# Patient Record
Sex: Male | Born: 2000 | Race: White | Hispanic: No | Marital: Single | Smoking: Never smoker
Health system: Southern US, Community
[De-identification: ages and names within clinical notes are randomized; demographics above are authoritative.]

## PROBLEM LIST (undated history)

## (undated) DIAGNOSIS — F988 Other specified behavioral and emotional disorders with onset usually occurring in childhood and adolescence: Secondary | ICD-10-CM

## (undated) HISTORY — PX: GUM SURGERY: SHX658

## (undated) HISTORY — PX: OTHER SURGICAL HISTORY: SHX169

---
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
## 2020-06-04 ENCOUNTER — Ambulatory Visit (HOSPITAL_COMMUNITY): Admission: EM | Admit: 2020-06-04 | Discharge status: Absent without leave | Payer: Self-pay | Source: Home / Self Care

## 2020-06-04 ENCOUNTER — Ambulatory Visit (INDEPENDENT_AMBULATORY_CARE_PROVIDER_SITE_OTHER): Payer: Commercial Managed Care - PPO

## 2020-06-04 ENCOUNTER — Encounter (HOSPITAL_COMMUNITY): Payer: Self-pay

## 2020-06-04 ENCOUNTER — Other Ambulatory Visit: Payer: Self-pay

## 2020-06-04 ENCOUNTER — Ambulatory Visit (HOSPITAL_COMMUNITY)
Admission: EM | Admit: 2020-06-04 | Discharge: 2020-06-04 | Disposition: A | Discharge status: Home / Self Care | Payer: Commercial Managed Care - PPO | Attending: Student | Admitting: Student

## 2020-06-04 DIAGNOSIS — S92424A Nondisplaced fracture of distal phalanx of right great toe, initial encounter for closed fracture: Secondary | ICD-10-CM | POA: Diagnosis not present

## 2020-06-04 DIAGNOSIS — M79671 Pain in right foot: Secondary | ICD-10-CM

## 2020-06-04 DIAGNOSIS — W19XXXA Unspecified fall, initial encounter: Secondary | ICD-10-CM | POA: Diagnosis not present

## 2020-06-04 NOTE — Discharge Instructions (Addendum)
-  Use your boot during the day, and at all times while walking.  -Follow-up with orthopedic doctor in the next 2-3 days (information provided below).

## 2020-06-04 NOTE — ED Provider Notes (Signed)
[facility]    CSN: [id number] Arrival date & time: 06/04/20  1328      History   Chief Complaint Chief Complaint  Patient presents with  . Foot Pain    HPI Jack Buchanan is a 20 y.o. male presenting for R foot pain since this morning. States he fell down stairs last night and didn notice pain until this morning. States he was walking down stairs and the automatic light went out, causing him to trip. He caught himself, but his right foot twisted under him. At the time it didn't hurt. However he woke up this mornign and noted pain with standing and moving his toes. deneis ankle injury. 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 Denies injury elsewhere.  HPI  Past Medical History:  Diagnosis Date  . ADD (attention deficit disorder)     Patient Active Problem List   Diagnosis Date Noted  . Precocious sexual development and puberty, not elsewhere classified 10/17/2010  . Lack of expected normal physiological development in childhood 10/17/2010  . Goiter, unspecified 10/17/2010    Past Surgical History:  Procedure Laterality Date  . GUM SURGERY    . tubes in ears         Home Medications    Prior to Admission medications   Medication Sig Start Date End Date Taking? Authorizing Provider  dexmethylphenidate (FOCALIN XR) 15 MG 24 hr capsule Take 15 mg by mouth every morning.    [provider]    Family History History reviewed. No pertinent family history.  Social History Social History   Tobacco Use  . Smoking status: Light Tobacco Smoker    Types: Cigarettes  . Smokeless tobacco: Never Used  Substance Use Topics  . Alcohol use: Yes  . Drug use: Never     Allergies   Motrin [ibuprofen]   Review of Systems Review of Systems  Musculoskeletal:       R foot pain  All other systems reviewed and are negative.    Physical Exam Triage Vital Signs ED Triage Vitals [06/04/20 1505]  Enc Vitals Group     BP (!)  135/59     Pulse Rate 65     Resp 17     Temp 98 F (36.7 C)     Temp Source Oral     SpO2 98 %     Weight      Height      Head Circumference      Peak Flow      Pain Score 4     Pain Loc      Pain Edu?      Excl. in GC?    No data found.  Updated Vital Signs BP (!) 135/59 (BP Location: Right Arm)   Pulse 65   Temp 98 F (36.7 C) (Oral)   Resp 17   SpO2 98%   Visual Acuity Right Eye Distance:   Left Eye Distance:   Bilateral Distance:    Right Eye Near:   Left Eye Near:    Bilateral Near:     Physical Exam Vitals reviewed.  Constitutional:      Appearance: Normal appearance.  Cardiovascular:     Rate and Rhythm: Normal rate and regular rhythm.     Heart sounds: Normal heart sounds.  Pulmonary:     Effort: Pulmonary effort is normal.     Breath sounds: Normal breath sounds.  Musculoskeletal:  Right foot: Normal range of motion. Tenderness and bony tenderness present. No swelling, deformity, foot drop or laceration.     Left foot: Normal. Normal range of motion. No swelling, deformity, foot drop, laceration, tenderness or bony tenderness.       Legs:     Comments: R foot with tenderness to palpation base R great toe. Tenderness extends through medial metatarsal bones. No bony deformity palpated, no ecchymosis or abrasion. ROM intact but with pain.   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 Nontender.   Neurological:     General: No focal deficit present.     Mental Status: He is alert and oriented to person, place, and time.  Psychiatric:        Mood and Affect: Mood normal.        Behavior: Behavior normal.        Thought Content: Thought content normal.        Judgment: Judgment normal.      UC Treatments / Results  Labs (all labs ordered are listed, but only abnormal results are displayed) Labs Reviewed - No data to display  EKG   Radiology DG Foot Complete Right  Result Date: 06/04/2020 CLINICAL DATA:  20 year old male  with fall. EXAM: RIGHT FOOT COMPLETE - 3+ VIEW COMPARISON:  None. FINDINGS: There is an avulsion fracture of the plantar base of the distal phalanx of the great toe. No other acute fracture. The bones are well mineralized. No dislocation. Mild soft tissue swelling of the great toe. No radiopaque foreign object or soft tissue gas. IMPRESSION: Avulsion fracture of the plantar base of the distal phalanx of the great toe. Electronically Signed   By: Elgie Collard M.D.   On: 06/04/2020 15:50    Procedures Procedures (including critical care time)  Medications Ordered in UC Medications - No data to display  Initial Impression / Assessment and Plan / UC Course  I have reviewed the triage vital signs and the nursing notes.  Pertinent labs & imaging results that were available during my care of the patient were reviewed by me and considered in my medical decision making (see chart for details).     Xray R foot: Avulsion fracture of the plantar base of the distal phalanx of the great toe. Boot applied. -Use your boot during the day, and at all times while walking.  -Follow-up with orthopedic doctor in the next 2-3 days (information provided below).  Work note provided.  Tylenol/ibuprofen for pain  Final Clinical Impressions(s) / UC Diagnoses   Final diagnoses:  Nondisplaced fracture of distal phalanx of right great toe, initial encounter for closed fracture     Discharge Instructions     -Use your boot during the day, and at all times while walking.  -Follow-up with orthopedic doctor in the next 2-3 days (information provided below).   ED Prescriptions    None     PDMP not reviewed this encounter.   Rhys Martini, PA-C 06/04/20 1615

## 2020-06-04 NOTE — ED Triage Notes (Signed)
Pt reports pain in the top of right foot since this morning. Reports he fell from the stairs last night and felt no pain.

## 2020-06-05 ENCOUNTER — Ambulatory Visit (INDEPENDENT_AMBULATORY_CARE_PROVIDER_SITE_OTHER): Payer: Commercial Managed Care - PPO | Admitting: Family Medicine

## 2020-06-05 ENCOUNTER — Encounter: Payer: Self-pay | Admitting: Family Medicine

## 2020-06-05 VITALS — BP 128/80 | Ht 71.0 in | Wt 240.0 lb

## 2020-06-05 DIAGNOSIS — S93601A Unspecified sprain of right foot, initial encounter: Secondary | ICD-10-CM | POA: Diagnosis not present

## 2020-06-05 NOTE — Progress Notes (Signed)
    SUBJECTIVE:   CHIEF COMPLAINT / HPI: Follow-up great toe fracture  Jack Buchanan is a 20 year old gentleman presenting for follow-up of a nondisplaced avulsion fracture of his great toe.  He fell on 1/22 down his stairs at home when he tripped due to not being able to see the steps when the lights accidentally turned off.  He rolled/twisted his right foot at that time but was able to get up and walk back to bed without pain.  However, the next morning when he woke up he was having tenderness on the dorsal aspect of his foot. Evaluated at [facility] on 1/23 and obtained x-rays showing an avulsion fracture of the plantar base of the distal phalanx of the right great toe.  Placed in a boot and recommended follow-up with orthopedics/sports medicine.  Today, he reports that his foot is feeling a little bit better since yesterday.  He has been wearing the boot with activity.  He states the pain is still on the dorsal aspect of his midfoot only, he has not had any tenderness with palpation or movement of his great toe.  Initially noted swelling on the upper part of his foot however that has substantially improved.  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 He plans on returning to work in 1 week if doing well.  PERTINENT  PMH / PSH: Anxiety  OBJECTIVE:   BP 128/80   Ht 5\' 11"  (1.803 m)   Wt 240 lb (108.9 kg)   BMI 33.47 kg/m   General: Alert, NAD, girlfriend present in the room HEENT: NCAT, MMM Lungs: No increased WOB   Right Foot compared to left: Inspection:  No erythema or bruising.  Mild soft tissue swelling noted to dorsum of right foot. Normal arch.  Palpation: No TTP to palpation of plantar portion of great toe.  TTP around talonavicular joint.  No tenderness around lateral malleolus. ROM: Full  ROM of the ankle with the exception of limited dorsiflexion on the right due to discomfort.  Full ROM of great toe without pain. Strength: 5/5 strength ankle in all  planes Neurovascular: N/V intact distally in the lower extremity Special tests: Negative anterior drawer. Negative squeeze. normal midfoot flexibility.   Limited foot U/S: No hypoechoic areas or fluid seen around talonavicular joint or over midfoot.  ASSESSMENT/PLAN:    Right foot strain: Acute.  Clinical presentation and history more consistent with sprain of his right foot given dorsal swelling and location of pain.  Reassuringly without any significant fluid or abnormalities on U/S, suggesting against tendinous tear or Lisfranc fracture of his midfoot.  Recommended continued use of boot for the next 2-3 days and then transition into good supportive shoe.  Tylenol/Aleve with frequent icing, rest, and elevation.  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 Will monitor.   Follow-up as needed if not improving.  Due to return to work on Monday, 1/31.  Instructed to call our office around this time if he needs more time from work for resolution.  08-18-1973, DO [facility]

## 2020-06-05 NOTE — Patient Instructions (Signed)
You have a foot sprain. Wear the boot for 2-3 more days when up and walking around then switch to supportive shoes. Icing 15 minutes at a time 3-4 times a day. If you're having problems still on Monday give me a call and I'll write a work note for you. Aleve or ibuprofen only if needed. Follow up with me as needed if you're doing well.

## 2020-11-28 ENCOUNTER — Emergency Department (HOSPITAL_COMMUNITY)
Admission: EM | Admit: 2020-11-28 | Discharge: 2020-11-28 | Disposition: A | Discharge status: Home / Self Care | Payer: Commercial Managed Care - PPO

## 2020-12-21 ENCOUNTER — Other Ambulatory Visit: Payer: Self-pay

## 2020-12-21 ENCOUNTER — Ambulatory Visit (INDEPENDENT_AMBULATORY_CARE_PROVIDER_SITE_OTHER): Payer: Commercial Managed Care - PPO | Admitting: Physician Assistant

## 2020-12-21 ENCOUNTER — Encounter: Payer: Self-pay | Admitting: Physician Assistant

## 2020-12-21 VITALS — BP 110/80 | HR 47 | Temp 97.2°F | Ht 71.0 in | Wt 224.2 lb

## 2020-12-21 DIAGNOSIS — R030 Elevated blood-pressure reading, without diagnosis of hypertension: Secondary | ICD-10-CM

## 2020-12-21 DIAGNOSIS — I861 Scrotal varices: Secondary | ICD-10-CM

## 2020-12-21 DIAGNOSIS — R001 Bradycardia, unspecified: Secondary | ICD-10-CM

## 2020-12-21 LAB — CBC WITH DIFFERENTIAL/PLATELET
Basophils Absolute: 0 10*3/uL (ref 0.0–0.1)
Basophils Relative: 0.5 % (ref 0.0–3.0)
Eosinophils Absolute: 0.1 10*3/uL (ref 0.0–0.7)
Eosinophils Relative: 1.6 % (ref 0.0–5.0)
HCT: 45.3 % (ref 39.0–52.0)
Hemoglobin: 15.5 g/dL (ref 13.0–17.0)
Lymphocytes Relative: 37.8 % (ref 12.0–46.0)
Lymphs Abs: 2 10*3/uL (ref 0.7–4.0)
MCHC: 34.3 g/dL (ref 30.0–36.0)
MCV: 88.2 fl (ref 78.0–100.0)
Monocytes Absolute: 0.5 10*3/uL (ref 0.1–1.0)
Monocytes Relative: 9.7 % (ref 3.0–12.0)
Neutro Abs: 2.6 10*3/uL (ref 1.4–7.7)
Neutrophils Relative %: 50.4 % (ref 43.0–77.0)
Platelets: 227 10*3/uL (ref 150.0–400.0)
RBC: 5.14 Mil/uL (ref 4.22–5.81)
RDW: 12.9 % (ref 11.5–15.5)
WBC: 5.2 10*3/uL (ref 4.0–10.5)

## 2020-12-21 LAB — COMPREHENSIVE METABOLIC PANEL
ALT: 36 U/L (ref 0–53)
AST: 30 U/L (ref 0–37)
Albumin: 4.2 g/dL (ref 3.5–5.2)
Alkaline Phosphatase: 71 U/L (ref 39–117)
BUN: 22 mg/dL (ref 6–23)
CO2: 28 mEq/L (ref 19–32)
Calcium: 9.5 mg/dL (ref 8.4–10.5)
Chloride: 103 mEq/L (ref 96–112)
Creatinine, Ser: 1.02 mg/dL (ref 0.40–1.50)
GFR: 104.73 mL/min (ref >60.00)
Glucose, Bld: 85 mg/dL (ref 70–99)
Potassium: 4.5 mEq/L (ref 3.5–5.1)
Sodium: 138 mEq/L (ref 135–145)
Total Bilirubin: 1 mg/dL (ref 0.2–1.2)
Total Protein: 7.2 g/dL (ref 6.0–8.3)

## 2020-12-21 LAB — TSH: TSH: 1.26 u[IU]/mL (ref 0.35–5.50)

## 2020-12-21 NOTE — Patient Instructions (Addendum)
It was great to see you!  CUT OUT THE SALT!!!!  Check BP once a week and bring record with you at next visit. If consistently <140/90, please come see me sooner rather than later.  If your varicocele starts to bother you more, let me know and I will refer you to urology.  Come see me in 1-3 months for physical, sooner if concerns.  Lelon Mast

## 2020-12-21 NOTE — Progress Notes (Signed)
Jack Buchanan is a 20 y.o. male here to establish care.  I acted as a Neurosurgeon for [employer], PA-C Corky Mull, LPN   History of Present Illness:   Chief Complaint  Patient presents with   Establish Care   Hypertension    Pt was told a couple of years ago that he had elevated BP readings at a Dentist office. He denies chest pain, headache, or leg swelling.    Elevated blood pressure Currently taking no medications. At home blood pressure readings are: 170/100 at the dentist. Patient denies chest pain, SOB, blurred vision, dizziness, unusual headaches, lower leg swelling. Denies excessive caffeine intake, stimulant usage, excessive alcohol intake.  Eats a ton of salt per his report. Drinks plenty of water and no alcohol.   BP Readings from Last 3 Encounters:  12/21/20 110/80  06/05/20 128/80  06/04/20 (!) 135/59    Depression screen PHQ 2/9 12/21/2020  Decreased Interest 0  Down, Depressed, Hopeless 0  PHQ - 2 Score 0    Varicocele Went to the ER in July for sudden acute scrotal pain. 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 He has used compressive underwear.  No flowsheet data found.   Other providers/specialists: Patient Care Team: Jarold Motto, [name] as PCP - General (Physician Assistant)   Past Medical History:  Diagnosis Date   ADD (attention deficit disorder)      Social History   Tobacco Use   Smoking status: Never   Smokeless tobacco: Never  Substance Use Topics   Alcohol use: Yes   Drug use: Never    Past Surgical History:  Procedure Laterality Date   GUM SURGERY     tubes in ears      Family History  Problem Relation Age of Onset   Diabetes Mother    Skin cancer Mother    Depression Mother    Diabetes Father     Allergies  Allergen Reactions   Motrin [Ibuprofen] Anaphylaxis     Current Medications:   Current Outpatient Medications:    dexmethylphenidate (FOCALIN XR) 15 MG 24 hr capsule, Take 15 mg by mouth every  morning., Disp: , Rfl:    Review of Systems:   ROS Negative unless otherwise specified per HPI.  Vitals:   Vitals:   12/21/20 1034 12/21/20 1109  BP: 110/60 110/80  Pulse: (!) 51 (!) 47  Temp: (!) 97.2 F (36.2 C)   TempSrc: Temporal   SpO2: 99%   Weight: 224 lb 3.2 oz (101.7 kg)   Height: 5\' 11"  (1.803 m)       Body mass index is 31.27 kg/m.  Physical Exam:   Physical Exam Vitals and nursing note reviewed.  Constitutional:      General: He is not in acute distress.    Appearance: He is well-developed. He is not ill-appearing or toxic-appearing.  Cardiovascular:     Rate and Rhythm: Regular rhythm. Bradycardia present.     Pulses: Normal pulses.     Heart sounds: Normal heart sounds, S1 normal and S2 normal.     Comments: No LE edema Pulmonary:     Effort: Pulmonary effort is normal.     Breath sounds: Normal breath sounds.  Skin:    General: Skin is warm and dry.  Neurological:     Mental Status: He is alert.     GCS: GCS eye subscore is 4. GCS verbal subscore is 5. GCS motor subscore is 6.  Psychiatric:  Speech: Speech normal.        Behavior: Behavior normal. Behavior is cooperative.     Assessment and Plan:   Quantrell was seen today for establish care and hypertension.  Diagnoses and all orders for this visit:  Blood pressure elevated without history of HTN; Bradycardia EKG tracing is personally reviewed.  EKG notes NSR with bradycardia.  No acute changes.  No red flags. Recommend check BP regularly weekly and follow-up in 1-3 months, sooner if BP consistently <140/90 or if symptomatic. Work on salt intake. -     EKG 12-Lead -     CBC with Differential/Platelet -     Comprehensive metabolic panel -     TSH  Varicocele No red flags on discussion. Literature provided. If becomes more bothersome, will refer to urology.  CMA or LPN served as scribe during this visit. History, Physical, and Plan performed by medical provider. The above  documentation has been reviewed and is accurate and complete.  Jarold Motto, PA-C

## 2021-02-01 ENCOUNTER — Encounter: Payer: Commercial Managed Care - PPO | Admitting: Physician Assistant

## 2021-02-06 ENCOUNTER — Ambulatory Visit (INDEPENDENT_AMBULATORY_CARE_PROVIDER_SITE_OTHER): Payer: Commercial Managed Care - PPO | Admitting: Physician Assistant

## 2021-02-06 ENCOUNTER — Other Ambulatory Visit: Payer: Self-pay

## 2021-02-06 ENCOUNTER — Encounter: Payer: Self-pay | Admitting: Physician Assistant

## 2021-02-06 VITALS — BP 120/76 | HR 60 | Temp 97.9°F | Ht 71.0 in | Wt 226.4 lb

## 2021-02-06 DIAGNOSIS — Z0001 Encounter for general adult medical examination with abnormal findings: Secondary | ICD-10-CM

## 2021-02-06 DIAGNOSIS — Z114 Encounter for screening for human immunodeficiency virus [HIV]: Secondary | ICD-10-CM

## 2021-02-06 DIAGNOSIS — Z1159 Encounter for screening for other viral diseases: Secondary | ICD-10-CM | POA: Diagnosis not present

## 2021-02-06 DIAGNOSIS — Z136 Encounter for screening for cardiovascular disorders: Secondary | ICD-10-CM

## 2021-02-06 DIAGNOSIS — Z13220 Encounter for screening for lipoid disorders: Secondary | ICD-10-CM | POA: Diagnosis not present

## 2021-02-06 DIAGNOSIS — Z8659 Personal history of other mental and behavioral disorders: Secondary | ICD-10-CM

## 2021-02-06 DIAGNOSIS — R030 Elevated blood-pressure reading, without diagnosis of hypertension: Secondary | ICD-10-CM

## 2021-02-06 DIAGNOSIS — Z23 Encounter for immunization: Secondary | ICD-10-CM | POA: Diagnosis not present

## 2021-02-06 DIAGNOSIS — F200 Paranoid schizophrenia: Secondary | ICD-10-CM | POA: Insufficient documentation

## 2021-02-06 DIAGNOSIS — E669 Obesity, unspecified: Secondary | ICD-10-CM

## 2021-02-06 DIAGNOSIS — G479 Sleep disorder, unspecified: Secondary | ICD-10-CM | POA: Insufficient documentation

## 2021-02-06 LAB — LIPID PANEL
Cholesterol: 162 mg/dL (ref 0–200)
HDL: 37 mg/dL — ABNORMAL LOW (ref >39.00)
LDL Cholesterol: 105 mg/dL — ABNORMAL HIGH (ref 0–99)
NonHDL: 125.35
Total CHOL/HDL Ratio: 4
Triglycerides: 104 mg/dL (ref 0.0–149.0)
VLDL: 20.8 mg/dL (ref 0.0–40.0)

## 2021-02-06 NOTE — Patient Instructions (Signed)
It was great to see you!  Referral for sleep study -- this is very important!  If you develop suicidal thoughts, please tell someone and immediately proceed to our local 24/7 crisis center, [facility]. [address] [place], [place] [address] [phone].  Please go to the lab for blood work.   Our office will call you with your results unless you have chosen to receive results via MyChart.  If your blood work is normal we will follow-up each year for physicals and as scheduled for chronic medical problems.  If anything is abnormal we will treat accordingly and get you in for a follow-up.  Take care,  Lelon Mast

## 2021-02-06 NOTE — Progress Notes (Addendum)
Subjective:    Jack Buchanan is a 20 y.o. male and is here for a comprehensive physical exam.  HPI  Health Maintenance Due  Topic Date Due   HIV Screening  Never done   Hepatitis C Screening  Never done   TETANUS/TDAP  01/10/2020    Acute Concerns Sleep Apnea:  He reports that he has concerns of possibly having sleep apnea, has daytime somnolence and does snore at night.  Denies ever being in a major accident. Snores at night per girlfriend, he has never had a sleep study.   Schizophrenia In the past he would have panic attacks daily when he looks at objects that looks weird. He had excessive depression and anxiety episodes at the age of 89-14 but returned as an adult. His mother has bipolar disorder and his father also has some underlying mental issues (unknown). Denies any HI or SI at this time. Does report history of visual and auditory hallucinations.  Chronic Issues: Elevated Blood Pressure:  He recently went to the dental office and had an elevated blood pressure.  He has had overall normal blood pressures since this visit.  Denies: Chest pain, shortness of breath, lower extremity swelling, vision changes. BP Readings from Last 3 Encounters:  02/06/21 120/76  12/21/20 110/80  06/05/20 128/80   Health Maintenance: Immunizations -- UTD Colonoscopy -- Not completed due to age  PSA -- Not complete due to age  Diet -- Reasonably healthy diet(chicken, vegetables, water, protein shake)  Sleep habits -- Snoring at times and randomly falls asleep at the wheel when driving.  Exercise -- Daily completes formal exercise  Weight -- stable Weight history Wt Readings from Last 10 Encounters:  02/06/21 226 lb 6.1 oz (102.7 kg)  12/21/20 224 lb 3.2 oz (101.7 kg)  06/05/20 240 lb (108.9 kg)  01/22/12 143 lb 15.4 oz (65.3 kg) (94 %, Z= 1.60)*   * Growth percentiles are based on CDC (Boys, 2-20 Years) data.   Body mass index is 31.57 kg/m.  Tobacco use --  Never Tobacco Use:  Low Risk    Smoking Tobacco Use: Never   Smokeless Tobacco Use: Never    Alcohol use --- 2 beverages in his lifetime  Depression screen [facility] 2/9 12/21/2020  Decreased Interest 0  Down, Depressed, Hopeless 0  PHQ - 2 Score 0   Other providers/specialists: Patient Care Team: Jarold Motto, [name] as PCP - General (Physician Assistant)   PMHx, SurgHx, SocialHx, Medications, and Allergies were reviewed in the Visit Navigator and updated as appropriate.   Past Medical History:  Diagnosis Date   ADD (attention deficit disorder)    Past Surgical History:  Procedure Laterality Date   GUM SURGERY     tubes in ears       Family History  Problem Relation Age of Onset   Diabetes Mother    Skin cancer Mother    Depression Mother    Diabetes Father     Social History   Tobacco Use   Smoking status: Never   Smokeless tobacco: Never  Substance Use Topics   Alcohol use: Yes    Comment: very rare   Drug use: Never    Review of Systems:   Review of Systems  Constitutional:  Negative for chills, fever, malaise/fatigue and weight loss.  HENT:  Negative for hearing loss, sinus pain and sore throat.   Respiratory:  Negative for cough and hemoptysis.   Cardiovascular:  Negative for chest pain, palpitations, leg swelling  and PND.  Gastrointestinal:  Negative for abdominal pain, constipation, diarrhea, heartburn, nausea and vomiting.  Genitourinary:  Negative for dysuria, frequency and urgency.  Musculoskeletal:  Negative for back pain, myalgias and neck pain.  Skin:  Negative for itching and rash.  Neurological:  Negative for dizziness, tingling, seizures and headaches.  Endo/Heme/Allergies:  Negative for polydipsia.  Psychiatric/Behavioral:  Negative for depression. The patient is not nervous/anxious.    Objective:   Vitals:   02/06/21 1100  BP: 120/76  Pulse: 60  Temp: 97.9 F (36.6 C)  SpO2: 98%   Body mass index is 31.57 kg/m.  General Appearance:  Alert,  cooperative, no distress, appears stated age  Head:  Normocephalic, without obvious abnormality, atraumatic  Eyes:  PERRL, conjunctiva/corneas clear, EOM's intact, fundi benign, both eyes, no nystagmus       Ears:  Normal TM's and external ear canals, both ears  Nose: Nares normal, septum midline, mucosa normal, no drainage    or sinus tenderness  Throat: Lips, mucosa, and tongue normal; teeth and gums normal  Neck: Supple, symmetrical, trachea midline, no adenopathy; thyroid:  No enlargement/tenderness/nodules; no carotit bruit or JVD  Back:   Symmetric, no curvature, ROM normal, no CVA tenderness  Lungs:   Clear to auscultation bilaterally, respirations unlabored  Chest wall:  No tenderness or deformity  Heart:  Regular rate and rhythm, S1 and S2 normal, no murmur, rub   or gallop  Abdomen:   Soft, non-tender, bowel sounds active all four quadrants, no masses, no organomegaly  Extremities: Extremities normal, atraumatic, no cyanosis or edema  Prostate: Not done.   Skin: Skin color, texture, turgor normal, no rashes or lesions  Lymph nodes: Cervical, supraclavicular, and axillary nodes normal  Neurologic: CNII-XII grossly intact. Normal strength, sensation and reflexes throughout    Assessment/Plan:   Encounter for general adult medical examination with abnormal findings Today patient counseled on age appropriate routine health concerns for screening and prevention, each reviewed and up to date or declined. Immunizations reviewed and up to date or declined. Labs ordered and reviewed. Risk factors for depression reviewed and negative. Hearing function and visual acuity are intact. ADLs screened and addressed as needed. Functional ability and level of safety reviewed and appropriate. Education, counseling and referrals performed based on assessed risks today. Patient provided with a copy of personalized plan for preventive services.  Screening for HIV (human immunodeficiency virus) Performed  today  Encounter for lipid screening for cardiovascular disease Update blood work today and provide recommendations accordingly  Encounter for screening for other viral diseases Performed hep C screening today  Need for prophylactic vaccination with combined diphtheria-tetanus-pertussis (DTP) vaccine Updated today  Blood pressure elevated without history of HTN Normotensive Continue to monitor as needed when symptomatic If blood pressure greater than 140/90 on a consistent basis, follow-up in our office Follow-up in 1 year  Personal history of schizophrenia Patient declines any need for psychiatry involvement at this time He appears without any significant psychosocial concerns today I discussed with patient that if they develop any SI, to tell someone immediately and seek medical attention.  Obesity  Continue to work on healthy diet and exercise regularly.  sleep disorder Referral for formal sleep study for further evaluation   Patient Counseling: [x]   Nutrition: Stressed importance of moderation in sodium/caffeine intake, saturated fat and cholesterol, caloric balance, sufficient intake of fresh fruits, vegetables, and fiber.  [x]   Stressed the importance of regular exercise.   []   Substance Abuse: Discussed cessation/primary prevention  of tobacco, alcohol, or other drug use; driving or other dangerous activities under the influence; availability of treatment for abuse.   [x]   Injury prevention: Discussed safety belts, safety helmets, smoke detector, smoking near bedding or upholstery.   []   Sexuality: Discussed sexually transmitted diseases, partner selection, use of condoms, avoidance of unintended pregnancy  and contraceptive alternatives.   [x]   Dental health: Discussed importance of regular tooth brushing, flossing, and dental visits.  [x]   Health maintenance and immunizations reviewed. Please refer to Health maintenance section.     I,Havlyn C Ratchford,acting as a  for , PA.,have documented all relevant documentation on the behalf of , PA,as directed by  , PA while in the presence of Neurosurgeon, [employer].    I, Jarold Motto, Jarold Motto, have reviewed all documentation for this visit. The documentation on 02/06/21 for the exam, diagnosis, procedures, and orders are all accurate and complete.   [name], PA-C [facility]

## 2021-02-06 NOTE — Progress Notes (Deleted)
SCRIBE STATEMENT  Subjective:    Jack Buchanan is a 20 y.o. male and is here for a comprehensive physical exam.  HPI  Health Maintenance Due  Topic Date Due   COVID-19 Vaccine (1) Never done   HIV Screening  Never done   Hepatitis C Screening  Never done   TETANUS/TDAP  01/10/2020   INFLUENZA VACCINE  12/11/2020    Acute Concerns:   Chronic Issues:   Health Maintenance: Immunizations -- *** Colonoscopy -- N/A PSA -- No results found for: PSA1, PSA Diet -- *** Sleep habits -- *** Exercise -- *** Weight --    Weight history Wt Readings from Last 10 Encounters:  12/21/20 224 lb 3.2 oz (101.7 kg)  06/05/20 240 lb (108.9 kg)  01/22/12 143 lb 15.4 oz (65.3 kg) (94 %, Z= 1.60)*   * Growth percentiles are based on CDC (Boys, 2-20 Years) data.   There is no height or weight on file to calculate BMI. Mood -- *** Tobacco use --  Tobacco Use: Low Risk    Smoking Tobacco Use: Never   Smokeless Tobacco Use: Never    Alcohol use ---  reports current alcohol use.   Depression screen PHQ 2/9 12/21/2020  Decreased Interest 0  Down, Depressed, Hopeless 0  PHQ - 2 Score 0     Other providers/specialists: Patient Care Team: Jarold Motto, [name] as PCP - General (Physician Assistant)   PMHx, SurgHx, SocialHx, Medications, and Allergies were reviewed in the Visit Navigator and updated as appropriate.   Past Medical History:  Diagnosis Date   ADD (attention deficit disorder)      Past Surgical History:  Procedure Laterality Date   GUM SURGERY     tubes in ears       Family History  Problem Relation Age of Onset   Diabetes Mother    Skin cancer Mother    Depression Mother    Diabetes Father     Social History   Tobacco Use   Smoking status: Never   Smokeless tobacco: Never  Substance Use Topics   Alcohol use: Yes   Drug use: Never    Review of Systems:   ROS  Objective:   There were no vitals filed for this visit. There is no height or weight  on file to calculate BMI.  General Appearance:  Alert, cooperative, no distress, appears stated age  Head:  Normocephalic, without obvious abnormality, atraumatic  Eyes:  PERRL, conjunctiva/corneas clear, EOM's intact, fundi benign, both eyes       Ears:  Normal TM's and external ear canals, both ears  Nose: Nares normal, septum midline, mucosa normal, no drainage    or sinus tenderness  Throat: Lips, mucosa, and tongue normal; teeth and gums normal  Neck: Supple, symmetrical, trachea midline, no adenopathy; thyroid:  No enlargement/tenderness/nodules; no carotit bruit or JVD  Back:   Symmetric, no curvature, ROM normal, no CVA tenderness  Lungs:   Clear to auscultation bilaterally, respirations unlabored  Chest wall:  No tenderness or deformity  Heart:  Regular rate and rhythm, S1 and S2 normal, no murmur, rub   or gallop  Abdomen:   Soft, non-tender, bowel sounds active all four quadrants, no masses, no organomegaly  Extremities: Extremities normal, atraumatic, no cyanosis or edema  Prostate: Not done.   Skin: Skin color, texture, turgor normal, no rashes or lesions  Lymph nodes: Cervical, supraclavicular, and axillary nodes normal  Neurologic: CNII-XII grossly intact. Normal strength, sensation and reflexes throughout  Assessment/Plan:   There are no diagnoses linked to this encounter.    Patient Counseling: [x]   Nutrition: Stressed importance of moderation in sodium/caffeine intake, saturated fat and cholesterol, caloric balance, sufficient intake of fresh fruits, vegetables, and fiber.  [x]   Stressed the importance of regular exercise.   []   Substance Abuse: Discussed cessation/primary prevention of tobacco, alcohol, or other drug use; driving or other dangerous activities under the influence; availability of treatment for abuse.   [x]   Injury prevention: Discussed safety belts, safety helmets, smoke detector, smoking near bedding or upholstery.   []   Sexuality: Discussed  sexually transmitted diseases, partner selection, use of condoms, avoidance of unintended pregnancy  and contraceptive alternatives.   [x]   Dental health: Discussed importance of regular tooth brushing, flossing, and dental visits.  [x]   Health maintenance and immunizations reviewed. Please refer to Health maintenance section.    ***  , PA-C [facility]

## 2021-02-07 LAB — HIV ANTIBODY (ROUTINE TESTING W REFLEX): HIV 1&2 Ab, 4th Generation: NONREACTIVE

## 2021-02-07 LAB — HEPATITIS C ANTIBODY
Hepatitis C Ab: NONREACTIVE
SIGNAL TO CUT-OFF: 0.01 (ref <1.00)

## 2021-04-13 ENCOUNTER — Encounter: Payer: Self-pay | Admitting: Physician Assistant

## 2021-04-13 ENCOUNTER — Other Ambulatory Visit: Payer: Self-pay

## 2021-04-13 ENCOUNTER — Ambulatory Visit (INDEPENDENT_AMBULATORY_CARE_PROVIDER_SITE_OTHER): Payer: Commercial Managed Care - PPO | Admitting: Physician Assistant

## 2021-04-13 VITALS — BP 131/82 | HR 62 | Temp 97.6°F | Ht 71.0 in | Wt 237.4 lb

## 2021-04-13 DIAGNOSIS — J0100 Acute maxillary sinusitis, unspecified: Secondary | ICD-10-CM | POA: Diagnosis not present

## 2021-04-13 DIAGNOSIS — R051 Acute cough: Secondary | ICD-10-CM | POA: Diagnosis not present

## 2021-04-13 MED ORDER — LEVOCETIRIZINE DIHYDROCHLORIDE 5 MG PO TABS: 5.0000 mg | ORAL_TABLET | Freq: Every evening | ORAL | 0 refills | Status: DC

## 2021-04-13 MED ORDER — AMOXICILLIN-POT CLAVULANATE 875-125 MG PO TABS: 1.0000 | ORAL_TABLET | Freq: Two times a day (BID) | ORAL | 0 refills | Status: AC

## 2021-04-13 MED ORDER — PREDNISONE 20 MG PO TABS: 20.0000 mg | ORAL_TABLET | Freq: Two times a day (BID) | ORAL | 0 refills | Status: AC

## 2021-04-13 MED ORDER — BENZONATATE 100 MG PO CAPS: 100.0000 mg | ORAL_CAPSULE | Freq: Three times a day (TID) | ORAL | 0 refills | Status: DC | PRN

## 2021-04-13 NOTE — Patient Instructions (Signed)
Good to meet you. Please take all medications as directed. You may also use nasal saline several times a day to help clear out your sinus passages.

## 2021-04-13 NOTE — Progress Notes (Signed)
Subjective:    Patient ID: Jack Buchanan, male    DOB: Jan 23, 2001, 20 y.o.   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, works outside   Past Medical History:  Diagnosis Date   ADD (attention deficit disorder)     Past Surgical History:  Procedure Laterality Date   GUM SURGERY     tubes in ears      Family History  Problem Relation Age of Onset   Diabetes Mother    Skin cancer Mother    Depression Mother    Bipolar disorder Mother    Diabetes Father     Social History   Tobacco Use   Smoking status: Never   Smokeless tobacco: Never  Substance Use Topics   Alcohol use: Yes    Comment: very rare   Drug use: Never     Allergies  Allergen Reactions   Motrin [Ibuprofen] Anaphylaxis    Review of Systems  Respiratory:  Positive for cough.   NEGATIVE UNLESS OTHERWISE INDICATED IN HPI      Objective:     BP 131/82   Pulse 62   Temp 97.6 F (36.4 C)   Ht 5\' 11"  (1.803 m)   Wt 237 lb 6.1 oz (107.7 kg)   SpO2 98%   BMI 33.11 kg/m   Wt Readings from Last 3 Encounters:  04/13/21 237 lb 6.1 oz (107.7 kg)  02/06/21 226 lb 6.1 oz (102.7 kg)  12/21/20 224 lb 3.2 oz (101.7 kg)    BP Readings from Last 3 Encounters:  04/13/21 131/82  02/06/21 120/76  12/21/20 110/80     Physical Exam Vitals and nursing note reviewed.  Constitutional:      General: He is not in acute distress.    Appearance: Normal appearance. He is not toxic-appearing.  HENT:     Head: Normocephalic and atraumatic.     Right Ear: Ear canal and external ear normal.     Left Ear: Ear canal and external ear normal.     Nose: Congestion present.      Right Turbinates: Enlarged and swollen.     Left Turbinates: Enlarged and swollen.     Right Sinus: Maxillary sinus tenderness present.     Left Sinus: Maxillary sinus tenderness present.     Mouth/Throat:     Mouth: Mucous membranes are moist.     Pharynx: Oropharynx is clear.  Eyes:     Extraocular Movements: Extraocular movements intact.     Conjunctiva/sclera: Conjunctivae normal.     Pupils: Pupils are equal, round, and reactive to light.  Cardiovascular:     Rate and Rhythm: Normal rate and regular rhythm.     Pulses: Normal pulses.     Heart sounds: Normal heart sounds.  Pulmonary:     Effort: Pulmonary effort is normal.     Breath sounds: Normal breath sounds.  Abdominal:     Tenderness: There is no abdominal tenderness.  Musculoskeletal:        General: Normal range of motion.     Cervical back: Normal range of motion and neck supple.  Skin:    General: Skin is  warm and dry.  Neurological:     General: No focal deficit present.     Mental Status: He is alert and oriented to person, place, and time.  Psychiatric:        Mood and Affect: Mood normal.        Behavior: Behavior normal.       Assessment & Plan:   Problem List Items Addressed This Visit   None Visit Diagnoses     Acute maxillary sinusitis, recurrence not specified    -  Primary   Relevant Medications   amoxicillin-clavulanate (AUGMENTIN) 875-125 MG tablet   predniSONE (DELTASONE) 20 MG tablet   benzonatate (TESSALON PERLES) 100 MG capsule   levocetirizine (XYZAL) 5 MG tablet   Acute cough            Meds ordered this encounter  Medications   amoxicillin-clavulanate (AUGMENTIN) 875-125 MG tablet    Sig: Take 1 tablet by mouth 2 (two) times daily for 7 days. Take with food.    Dispense:  14 tablet    Refill:  0   predniSONE (DELTASONE) 20 MG tablet    Sig: Take 1 tablet (20 mg total) by mouth 2 (two) times daily with a meal for 5 days.    Dispense:  10 tablet    Refill:  0   benzonatate  (TESSALON PERLES) 100 MG capsule    Sig: Take 1 capsule (100 mg total) by mouth 3 (three) times daily as needed for cough.    Dispense:  20 capsule    Refill:  0   levocetirizine (XYZAL) 5 MG tablet    Sig: Take 1 tablet (5 mg total) by mouth every evening.    Dispense:  30 tablet    Refill:  0   Plan: Persistent symptoms >10 days despite conservative efforts at home. Will Rx Augmentin at this time, take with food. Cautioned on antibiotic use and possible side effects. Advised nasal saline, humidifier, and pushing fluids. Prednisone, Xyzal, tessalon perles for added relief. Possible SE discussed. Call if worse or no improvement.    Rajah Lamba M Concepcion Gillott, PA-C

## 2021-04-25 ENCOUNTER — Encounter: Payer: Self-pay | Admitting: Physician Assistant

## 2021-04-25 ENCOUNTER — Ambulatory Visit (INDEPENDENT_AMBULATORY_CARE_PROVIDER_SITE_OTHER): Payer: Commercial Managed Care - PPO | Admitting: Physician Assistant

## 2021-04-25 ENCOUNTER — Other Ambulatory Visit: Payer: Self-pay

## 2021-04-25 VITALS — BP 140/80 | HR 72 | Temp 98.1°F | Ht 71.0 in | Wt 234.5 lb

## 2021-04-25 DIAGNOSIS — R052 Subacute cough: Secondary | ICD-10-CM

## 2021-04-25 MED ORDER — FAMOTIDINE 40 MG PO TABS: 40.0000 mg | ORAL_TABLET | Freq: Every day | ORAL | 1 refills | Status: DC

## 2021-04-25 MED ORDER — ALBUTEROL SULFATE HFA 108 (90 BASE) MCG/ACT IN AERS: 2.0000 | INHALATION_SPRAY | Freq: Four times a day (QID) | RESPIRATORY_TRACT | 2 refills | Status: DC | PRN

## 2021-04-25 MED ORDER — MONTELUKAST SODIUM 10 MG PO TABS: 10.0000 mg | ORAL_TABLET | Freq: Every day | ORAL | 1 refills | Status: DC

## 2021-04-25 NOTE — Patient Instructions (Addendum)
It was great to see you!  Start pepcid -- this is for heartburn  Start singulair -- this is for your seasonal allergies -- RARE adverse side effect of this causing suicidal thoughts -- please tell someone you are starting this and if develop suicidal thoughts, stop medication and call 9-1-1  Continue xyzal -- this is for allergies/drainage  Use as needed inhaler for wheezing  Follow-up if no improvement or any worsening in 2-4 weeks  Take care,  Jarold Motto PA-C

## 2021-04-25 NOTE — Progress Notes (Signed)
Jack Buchanan is a 20 y.o. male here for a follow up of sinusitis.    History of Present Illness:   Chief Complaint  Patient presents with   Cough    Pt c/o cough, restarted again couple days ago.    Cough On 04/13/21, Felicia was seen by Ila Mcgill, PA for cough and sinus congestion that had been onset for 1 month and a half. In addition to cough and sinus congestion, he also felt facial pressure. At that time he denied fever, chills, body aches, nausea, vomiting, diarrhea, or headache. Following this visit he was prescribed augmentin 875-125 mg daily, prednisone 20 mg daily, Xyzal 5 mg daily , and tessalon perles 100 mg TID as needed.   Currently Aedin states he had experienced relief upon taking the previosuly prescribed medications, but noticed the cough had returned. Reports the cough occurs sporadically and can be triggered by him laughing at something. States this happened last year but upon increasing his exercise and maintaining a healthy diet, it resolved itself until December of 2021.   In an effort to manage his sx, he has continued taking Xyzal 5 mg daily which provided him with temporary relief. After further discussion, [place] did admit that he has a hx of acid reflux but just noticed this about two weeks ago. Denies fever, chills, CP, or SOB.   Past Medical History:  Diagnosis Date   ADD (attention deficit disorder)      Social History   Tobacco Use   Smoking status: Never   Smokeless tobacco: Never  Substance Use Topics   Alcohol use: Yes    Comment: very rare   Drug use: Never    Past Surgical History:  Procedure Laterality Date   GUM SURGERY     tubes in ears      Family History  Problem Relation Age of Onset   Diabetes Mother    Skin cancer Mother    Depression Mother    Bipolar disorder Mother    Diabetes Father     Allergies  Allergen Reactions   Motrin [Ibuprofen] Anaphylaxis    Current Medications:   Current Outpatient Medications:     benzonatate (TESSALON PERLES) 100 MG capsule, Take 1 capsule (100 mg total) by mouth 3 (three) times daily as needed for cough., Disp: 20 capsule, Rfl: 0   levocetirizine (XYZAL) 5 MG tablet, Take 1 tablet (5 mg total) by mouth every evening., Disp: 30 tablet, Rfl: 0   Review of Systems:   Review of Systems  Respiratory:  Positive for cough.   Negative unless otherwise specified per HPI. Vitals:   Vitals:   04/25/21 1334  BP: 140/80  Pulse: 72  Temp: 98.1 F (36.7 C)  TempSrc: Temporal  SpO2: 98%  Weight: 234 lb 8 oz (106.4 kg)  Height: 5\' 11"  (1.803 m)     Body mass index is 32.71 kg/m.  Physical Exam:   Physical Exam Vitals and nursing note reviewed.  Constitutional:      General: He is not in acute distress.    Appearance: He is well-developed. He is not ill-appearing or toxic-appearing.  Cardiovascular:     Rate and Rhythm: Normal rate and regular rhythm.     Pulses: Normal pulses.     Heart sounds: Normal heart sounds, S1 normal and S2 normal.  Pulmonary:     Effort: Pulmonary effort is normal.     Breath sounds: Normal breath sounds.  Skin:    General: Skin is  warm and dry.  Neurological:     Mental Status: He is alert.     GCS: GCS eye subscore is 4. GCS verbal subscore is 5. GCS motor subscore is 6.  Psychiatric:        Speech: Speech normal.        Behavior: Behavior normal. Behavior is cooperative.    Assessment and Plan:   Subacute cough Uncontrolled Suspect component of upper airway cough syndrome Will start pepcid 40 mg daily, singulair 10 mg daily (black box warning of SI advised) Continue xyzal daily Use albuterol prn No obvious signs of infection on exam  Consider referral to pulmonary if symptoms persist  I,Havlyn C Ratchford,acting as a scribe for [employer], PA.,have documented all relevant documentation on the behalf of Jarold Motto, PA,as directed by  Jarold Motto, PA while in the presence of Jarold Motto, [name].  I,  Jarold Motto, [name], have reviewed all documentation for this visit. The documentation on 04/25/21 for the exam, diagnosis, procedures, and orders are all accurate and complete.   Jarold Motto, PA-C

## 2021-05-02 ENCOUNTER — Encounter: Payer: Self-pay | Admitting: Neurology

## 2021-05-02 ENCOUNTER — Institutional Professional Consult (permissible substitution): Payer: Commercial Managed Care - PPO | Admitting: Neurology

## 2021-05-06 ENCOUNTER — Other Ambulatory Visit: Payer: Self-pay | Admitting: Physician Assistant

## 2021-05-17 NOTE — Progress Notes (Signed)
Jack Buchanan is a 21 y.o. male here for ongoing cough.   History of Present Illness:   Chief Complaint  Patient presents with   Cough    Pt has been having a continuous cough x 1 month. Is having some pain with coughing in back and his head. Coughing and expectorating clear sputum. Denies fever or chills.    HPI  Cough Jack Buchanan returns with concerns due to cough that has been onset for a couple of months. Initially pt was seen by Jack Buchanan and was prescribed augmentin 875-125 mg daily, prednisone 20 mg daily, Xyzal 5 mg daily, and tessalon perles 100 mg TID as needed due to sx. Although he experienced relief, he visited me on 04/25/21 due to recurrence of sx. During this visit,  Jack Buchanan stated his cough had returned sporadically more specifically after he had just laughed at something. He reported that something similar had occurred in 2021 when experienced a cough that had suddenly resolved itself before returning in the winter. At the time of this visit, Jack Buchanan reported that he was still taking Xyzal 5 mg daily which provided temporary relief.   Currently [name] reports that he is now expectorating clear sputum and having longer cough spells with occasional wheezing. [name] expressed that cough has gotten so bad that he'll occasionally have pain in his back and head from coughing. 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 I also informed Jack Buchanan to continue daily use of Xyzal 5 mg.   Although pt has been compliant with these medications and use of albuterol inhaler, about 1-2 times daily, he has only received temporary relief from tessalon perles 100 mg. Pt does have a hx of asthma but doesn't believe current sx to be related to past issue. Denies ear pain, sore throat, or unintentional weight loss.   Past Medical History:  Diagnosis Date   ADD (attention deficit  disorder)      Social History   Tobacco Use   Smoking status: Never   Smokeless tobacco: Never  Substance Use Topics   Alcohol use: Yes    Comment: very rare   Drug use: Never    Past Surgical History:  Procedure Laterality Date   GUM SURGERY     tubes in ears      Family History  Problem Relation Age of Onset   Diabetes Mother    Skin cancer Mother    Depression Mother    Bipolar disorder Mother    Diabetes Father     Allergies  Allergen Reactions   Motrin [Ibuprofen] Anaphylaxis    Current Medications:   Current Outpatient Medications:    albuterol (VENTOLIN HFA) 108 (90 Base) MCG/ACT inhaler, Inhale 2 puffs into the lungs every 6 (six) hours as needed for wheezing or shortness of breath., Disp: 8 g, Rfl: 2   benzonatate (TESSALON) 100 MG capsule, TAKE 1 CAPSULE BY MOUTH THREE TIMES A DAY AS NEEDED FOR COUGH, Disp: 20 capsule, Rfl: 0   famotidine (PEPCID) 40 MG tablet, Take 1 tablet (40 mg total) by mouth daily., Disp: 90 tablet, Rfl: 1   levocetirizine (XYZAL) 5 MG tablet, TAKE 1 TABLET BY MOUTH EVERY DAY IN THE EVENING, Disp: 30 tablet, Rfl: 0   montelukast (SINGULAIR) 10 MG tablet, Take 1 tablet (10 mg total) by mouth at bedtime., Disp: 90 tablet, Rfl: 1  Review of Systems:   ROS Negative unless otherwise specified per HPI.  Vitals:   Vitals:   05/18/21 0901  BP: 130/76  Pulse: 75  Temp: 97.6 F (36.4 C)  TempSrc: Temporal  SpO2: 97%  Weight: 238 lb 4 oz (108.1 kg)  Height: 5\' 11"  (1.803 m)     Body mass index is 33.23 kg/m.  Physical Exam:   Physical Exam Vitals and nursing note reviewed.  Constitutional:      General: He is not in acute distress.    Appearance: He is well-developed. He is not ill-appearing or toxic-appearing.  Cardiovascular:     Rate and Rhythm: Normal rate and regular rhythm.     Pulses: Normal pulses.     Heart sounds: Normal heart sounds, S1 normal and S2 normal.  Pulmonary:     Effort: Pulmonary effort is  normal.     Breath sounds: Normal breath sounds.  Skin:    General: Skin is warm and dry.  Neurological:     Mental Status: He is alert.     GCS: GCS eye subscore is 4. GCS verbal subscore is 5. GCS motor subscore is 6.  Psychiatric:        Speech: Speech normal.        Behavior: Behavior normal. Behavior is cooperative.    Assessment and Plan:   Chronic cough No red flags on exam Stop Singulair 10 mg daily, Pepcid 40 mg daily, and Xyzal 5 mg daily  Start Flovent Inhaler-- 2 puffs in AM and PM Ordered Chest X-ray for further evaluation, will make recommendations as indicated by results I advised patient that if inhaler is too expensive, to ask pharmacist which steroid-inhaler would be more affordable with patient's insurance Informed patient that if cough worsens/persists, to let me know and referral for pulmonology will be put in   Jack Buchanan,acting as a scribe for [place], PA.,have documented all relevant documentation on the behalf of [employer], PA,as directed by  Jack Motto, PA while in the presence of Jack Buchanan, Jack Buchanan.  I, Jack Buchanan, Jack Buchanan, have reviewed all documentation for this visit. The documentation on 05/18/21 for the exam, diagnosis, procedures, and orders are all accurate and complete.  07/16/21, PA-C

## 2021-05-18 ENCOUNTER — Other Ambulatory Visit: Payer: Self-pay

## 2021-05-18 ENCOUNTER — Ambulatory Visit (INDEPENDENT_AMBULATORY_CARE_PROVIDER_SITE_OTHER): Payer: Commercial Managed Care - PPO | Admitting: Physician Assistant

## 2021-05-18 ENCOUNTER — Encounter: Payer: Self-pay | Admitting: Physician Assistant

## 2021-05-18 VITALS — BP 130/76 | HR 75 | Temp 97.6°F | Ht 71.0 in | Wt 238.2 lb

## 2021-05-18 DIAGNOSIS — R053 Chronic cough: Secondary | ICD-10-CM | POA: Diagnosis not present

## 2021-05-18 MED ORDER — FLUTICASONE PROPIONATE HFA 110 MCG/ACT IN AERO: 2.0000 | INHALATION_SPRAY | Freq: Two times a day (BID) | RESPIRATORY_TRACT | 12 refills | Status: DC

## 2021-05-18 NOTE — Patient Instructions (Addendum)
It was great to see you!  Start FLOVENT inhaler -- two puffs in the AM and in the PM If this is too expensive -- please ask pharmacist which steroid-inhaler would be more affordable on your insurance and call our office.  An order for an xray has been put in for you. To get your xray, you can walk in at the [facility] location without a scheduled appointment.  The address is [address]. It is across the street from [facility] is located in the basement.  Hours of operation are M-F 8:30am to 5:00pm. Please note that they are closed for lunch between 12:30 and 1:00pm.  If cough continues to worsen/persist -- please let me know and I will put in a referral for you to see pulmonology  STOP the singulair, pepcid and xyzal   Take care,  Inda Coke PA-C

## 2021-05-22 ENCOUNTER — Ambulatory Visit (INDEPENDENT_AMBULATORY_CARE_PROVIDER_SITE_OTHER)
Admission: RE | Admit: 2021-05-22 | Discharge: 2021-05-22 | Disposition: A | Discharge status: Home / Self Care | Payer: Commercial Managed Care - PPO | Source: Ambulatory Visit | Attending: Physician Assistant | Admitting: Physician Assistant

## 2021-05-22 ENCOUNTER — Other Ambulatory Visit: Payer: Self-pay

## 2021-05-22 DIAGNOSIS — R053 Chronic cough: Secondary | ICD-10-CM | POA: Diagnosis not present

## 2021-08-14 ENCOUNTER — Ambulatory Visit (INDEPENDENT_AMBULATORY_CARE_PROVIDER_SITE_OTHER): Payer: Self-pay | Admitting: Physician Assistant

## 2021-08-14 VITALS — BP 120/70 | HR 48 | Ht 71.0 in | Wt 220.0 lb

## 2021-08-14 DIAGNOSIS — F5105 Insomnia due to other mental disorder: Secondary | ICD-10-CM

## 2021-08-14 DIAGNOSIS — F429 Obsessive-compulsive disorder, unspecified: Secondary | ICD-10-CM

## 2021-08-14 DIAGNOSIS — F99 Mental disorder, not otherwise specified: Secondary | ICD-10-CM

## 2021-08-14 DIAGNOSIS — F200 Paranoid schizophrenia: Secondary | ICD-10-CM

## 2021-08-14 DIAGNOSIS — R1013 Epigastric pain: Secondary | ICD-10-CM

## 2021-08-14 MED ORDER — HYDROXYZINE HCL 50 MG PO TABS: 50.0000 mg | ORAL_TABLET | Freq: Every day | ORAL | 0 refills | Status: DC | PRN

## 2021-08-14 MED ORDER — ONDANSETRON HCL 4 MG PO TABS: 4.0000 mg | ORAL_TABLET | Freq: Three times a day (TID) | ORAL | 0 refills | Status: DC | PRN

## 2021-08-14 NOTE — Progress Notes (Signed)
Jack Buchanan is a 21 y.o. male here for abdominal pain. ? ?History of Present Illness:  ? ?Chief Complaint  ?Patient presents with  ? Abdominal Pain  ?  Pt stated that he has been having stomach pain for about 3/4 days and not eating as much as to 1x a day as compared to every couple of hrs. Also having sleeping issues for the past 70mos. Went to bed 4:30 and woke up at 6:30 and seems to be the same routine. He feels like over the past 2 mos he has only been getting about 4hrs of sleep.  ? ?Jack Buchanan's significant other, Aldona Bar, was on the phone during today's visit and asked about current concerns with his permission.  ? ?Generalized Abdominal Pain  ?Jack Buchanan presents with c/o generalized abdominal pain that has been onset for 3-4 days. States that he has also experienced decreased appetite and nausea. Reports that he is used to eating something multiple times throughout the day but is now only about to eat something as light as chicken tenders with a protein shake once a day. Initially he believed this was caused by stress and lack of sleep, but wanted to have this further evaluated. Pt does have a hx of rectal bleeding but states this was years ago when he was on several psychiatric medications.  ? ?Denies vomiting, diarrhea, constipation, melena, rectal bleeding, or hematochezia.  ? ?Decreased Sleep; Anxiety ?Jack Buchanan reports he has been having trouble staying asleep for the past 2 months due to anxious thoughts. Pt admits that although he always experiences anxious thoughts and what he describes as paranoia, this has worsened recently. According to him, he feels tired but is never able to sleep for an adequate amount of time due to his thoughts. Pt does have a past diagnosis of schizophrenia, but doesn't believe this to be the case. He was on medications such as seroquel and lithium but states he stayed awake one this medication and experienced nightmares in rare times that he did sleep. Due to that he had stopped those  medications and is hesitant to restart these. At this time, he is interested in trialing a sleep medication to see if this would improve his sx.  ? ?Denies concerns for bipolar disorder or SI/HI.  ? ?Past Medical History:  ?Diagnosis Date  ? ADD (attention deficit disorder)   ? ?  ?Social History  ? ?Tobacco Use  ? Smoking status: Never  ? Smokeless tobacco: Never  ?Substance Use Topics  ? Alcohol use: Yes  ?  Comment: very rare  ? Drug use: Never  ? ? ?Past Surgical History:  ?Procedure Laterality Date  ? GUM SURGERY    ? tubes in ears    ? ? ?Family History  ?Problem Relation Age of Onset  ? Diabetes Mother   ? Skin cancer Mother   ? Depression Mother   ? Bipolar disorder Mother   ? Diabetes Father   ? ? ?Allergies  ?Allergen Reactions  ? Motrin [Ibuprofen] Anaphylaxis  ? ? ?Current Medications:  ? ?Current Outpatient Medications:  ?  albuterol (VENTOLIN HFA) 108 (90 Base) MCG/ACT inhaler, Inhale 2 puffs into the lungs every 6 (six) hours as needed for wheezing or shortness of breath., Disp: 8 g, Rfl: 2 ?  benzonatate (TESSALON) 100 MG capsule, TAKE 1 CAPSULE BY MOUTH THREE TIMES A DAY AS NEEDED FOR COUGH, Disp: 20 capsule, Rfl: 0 ?  fluticasone (FLOVENT HFA) 110 MCG/ACT inhaler, Inhale 2 puffs into the lungs in  the morning and at bedtime., Disp: 1 each, Rfl: 12  ? ?Review of Systems:  ? ?Review of Systems  ?Gastrointestinal:  Positive for abdominal pain.  ? ?Vitals:  ? ?Vitals:  ? 08/14/21 1003  ?BP: 120/70  ?Pulse: (!) 48  ?SpO2: 98%  ?Weight: 220 lb (99.8 kg)  ?Height: 5\' 11"  (1.803 m)  ?   ?Body mass index is 30.68 kg/m?. ? ?Physical Exam:  ? ?Physical Exam ?Vitals and nursing note reviewed.  ?Constitutional:   ?   General: He is not in acute distress. ?   Appearance: Normal appearance. He is well-developed. He is not ill-appearing or toxic-appearing.  ?Cardiovascular:  ?   Rate and Rhythm: Normal rate and regular rhythm.  ?   Pulses: Normal pulses.  ?   Heart sounds: Normal heart sounds, S1 normal and S2  normal.  ?Pulmonary:  ?   Effort: Pulmonary effort is normal.  ?   Breath sounds: Normal breath sounds.  ?Skin: ?   General: Skin is warm and dry.  ?Neurological:  ?   Mental Status: He is alert.  ?   GCS: GCS eye subscore is 4. GCS verbal subscore is 5. GCS motor subscore is 6.  ?Psychiatric:     ?   Speech: Speech normal.     ?   Behavior: Behavior normal. Behavior is cooperative.  ? ? ?Assessment and Plan:  ? ?Paranoid schizophrenia ?Uncontrolled ?Patient does not demonstrate any SI/HI ?Recommended evaluation at [facility] or psychiatry referral as this is out of my scope however he declined ?Patient's significant other, Aldona Bar, was on the phone today and was in agreement to plan ? ?Insomnia due to other mental disorder ?Uncontrolled ?Start hydroxyzine 25 mg nightly,1-2 hours prior to bed  ?May increase hydroxyzine by 25 mg, up to 100 mg, each night as needed ?Low threshold to visit behavioral health urgent care if worsening sx or concerning side effects ?Follow up in 2 weeks with significant other if new/worsening symptoms  ? ?Epigastric pain ?No red flags-- suspect gastritis ?Trial Zofran 4 mg as needed for nausea with prn prilosec/nexium ?Bland foods ?Follow up if new/worsening symptoms or concerns occur  ? ?I,Jack Buchanan,acting as a scribe for [employer], PA.,have documented all relevant documentation on the behalf of Jack Coke, PA,as directed by  Jack Coke, PA while in the presence of Jack Buchanan, [place]. ? ?IInda Coke, PA, have reviewed all documentation for this visit. The documentation on 08/14/21 for the exam, diagnosis, procedures, and orders are all accurate and complete. ? ?Time spent with patient today was 65 minutes which consisted of chart review, discussing diagnosis, work up, treatment answering questions and documentation. ? ? ?Jack Coke, PA-C ? ?

## 2021-08-14 NOTE — Patient Instructions (Addendum)
It was great to see you! ? ?If you develop suicidal thoughts, please tell someone and immediately proceed to our local 24/7 crisis center, [facility]. ?[address] [place], [place] [address] ?[phone]. ? ?Start 1/2 tablet of hydroxyzine (25 mg) approximately 1-2 hours prior to bed ?The next night, if needed, may do a full tablet (50 mg) ?The following night, if needed, may do 1.5 tablets (75 mg) ?The following night, if needed, may do 2 tablets (100 mg) ? ?Zofran as needed for nausea ? ?Please follow-up in two weeks and bring [facility]. ? ?Take care, ? ?Jarold Motto PA-C  ?

## 2021-08-17 ENCOUNTER — Encounter: Payer: Self-pay | Admitting: Physician Assistant

## 2021-08-20 NOTE — Telephone Encounter (Signed)
Please see message and advise 

## 2021-08-20 NOTE — Telephone Encounter (Signed)
Tried to contact pt no answer and no answering machine. Will send My Chart message. ?

## 2021-08-21 ENCOUNTER — Encounter: Payer: Self-pay | Admitting: Physician Assistant

## 2021-09-10 ENCOUNTER — Other Ambulatory Visit: Payer: Self-pay | Admitting: Physician Assistant

## 2022-01-15 IMAGING — DX DG CHEST 2V
2 series · 2 of 2 positions shown · non-contrast
Comparison: None.

CLINICAL DATA: cough x2 months

EXAM:
CHEST - 2 VIEW

[chest pa]
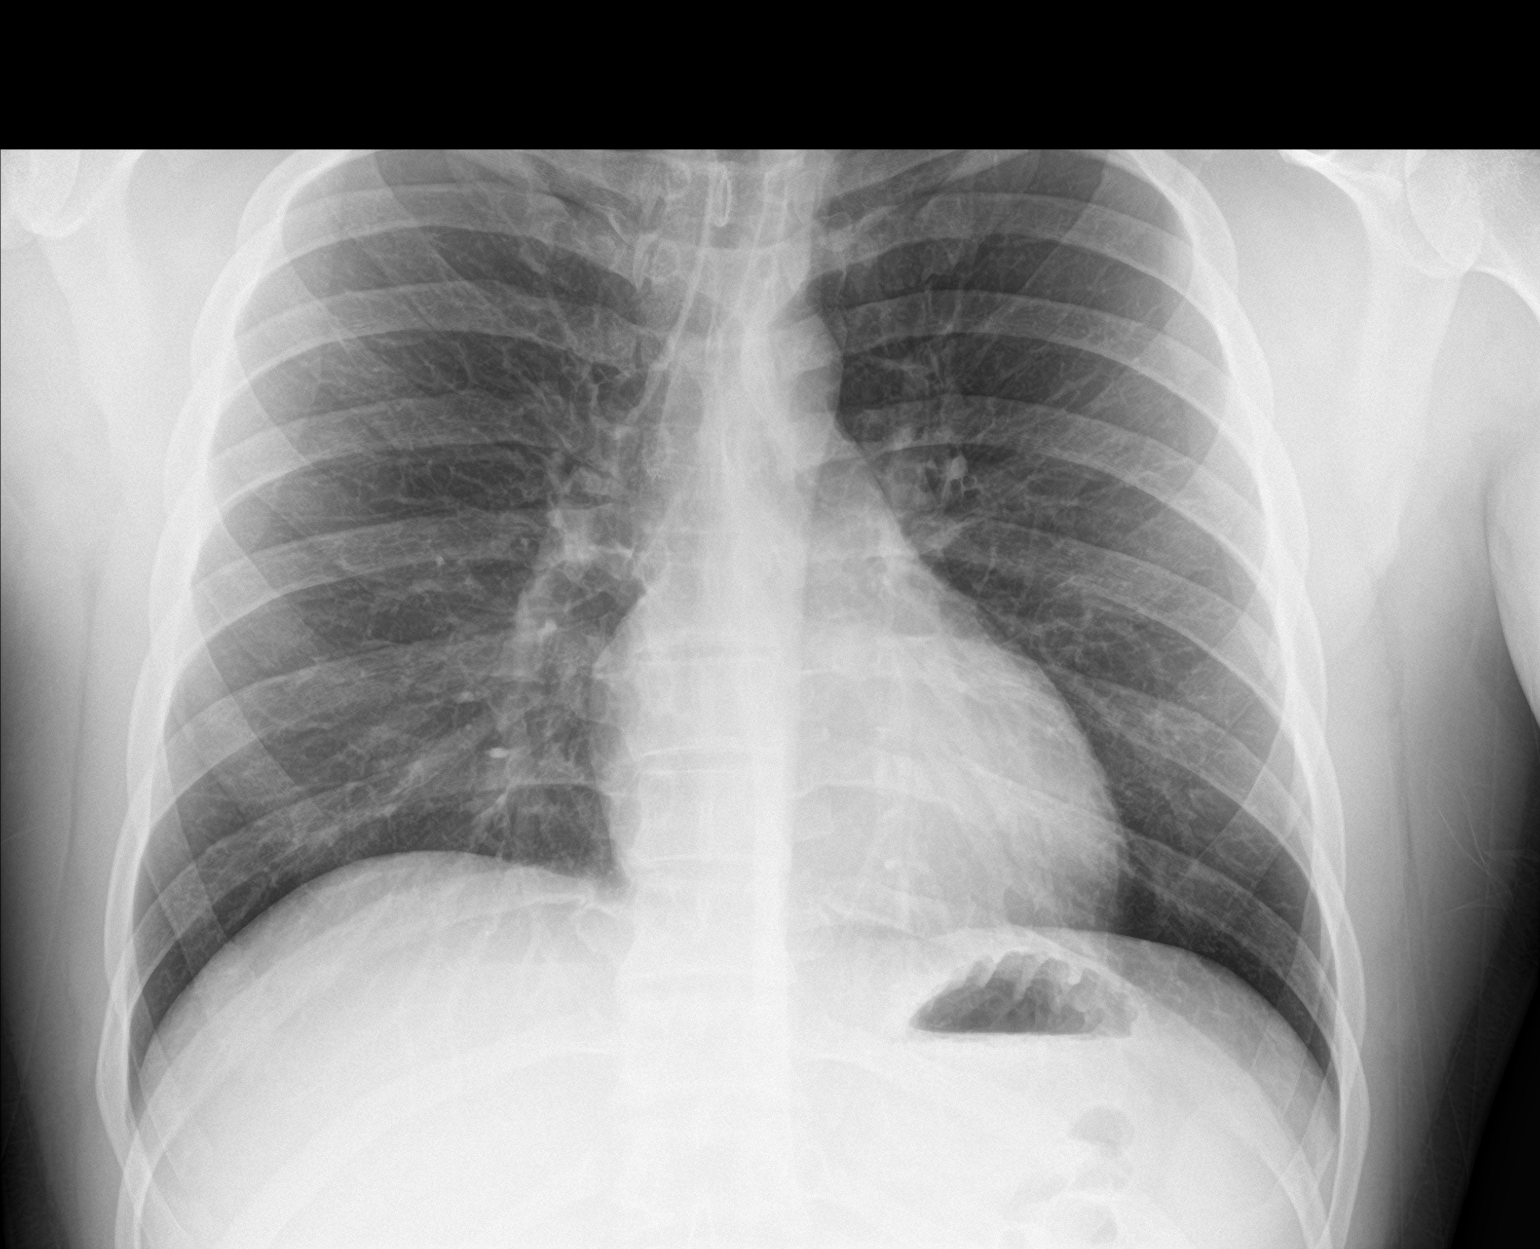

[chest lat]
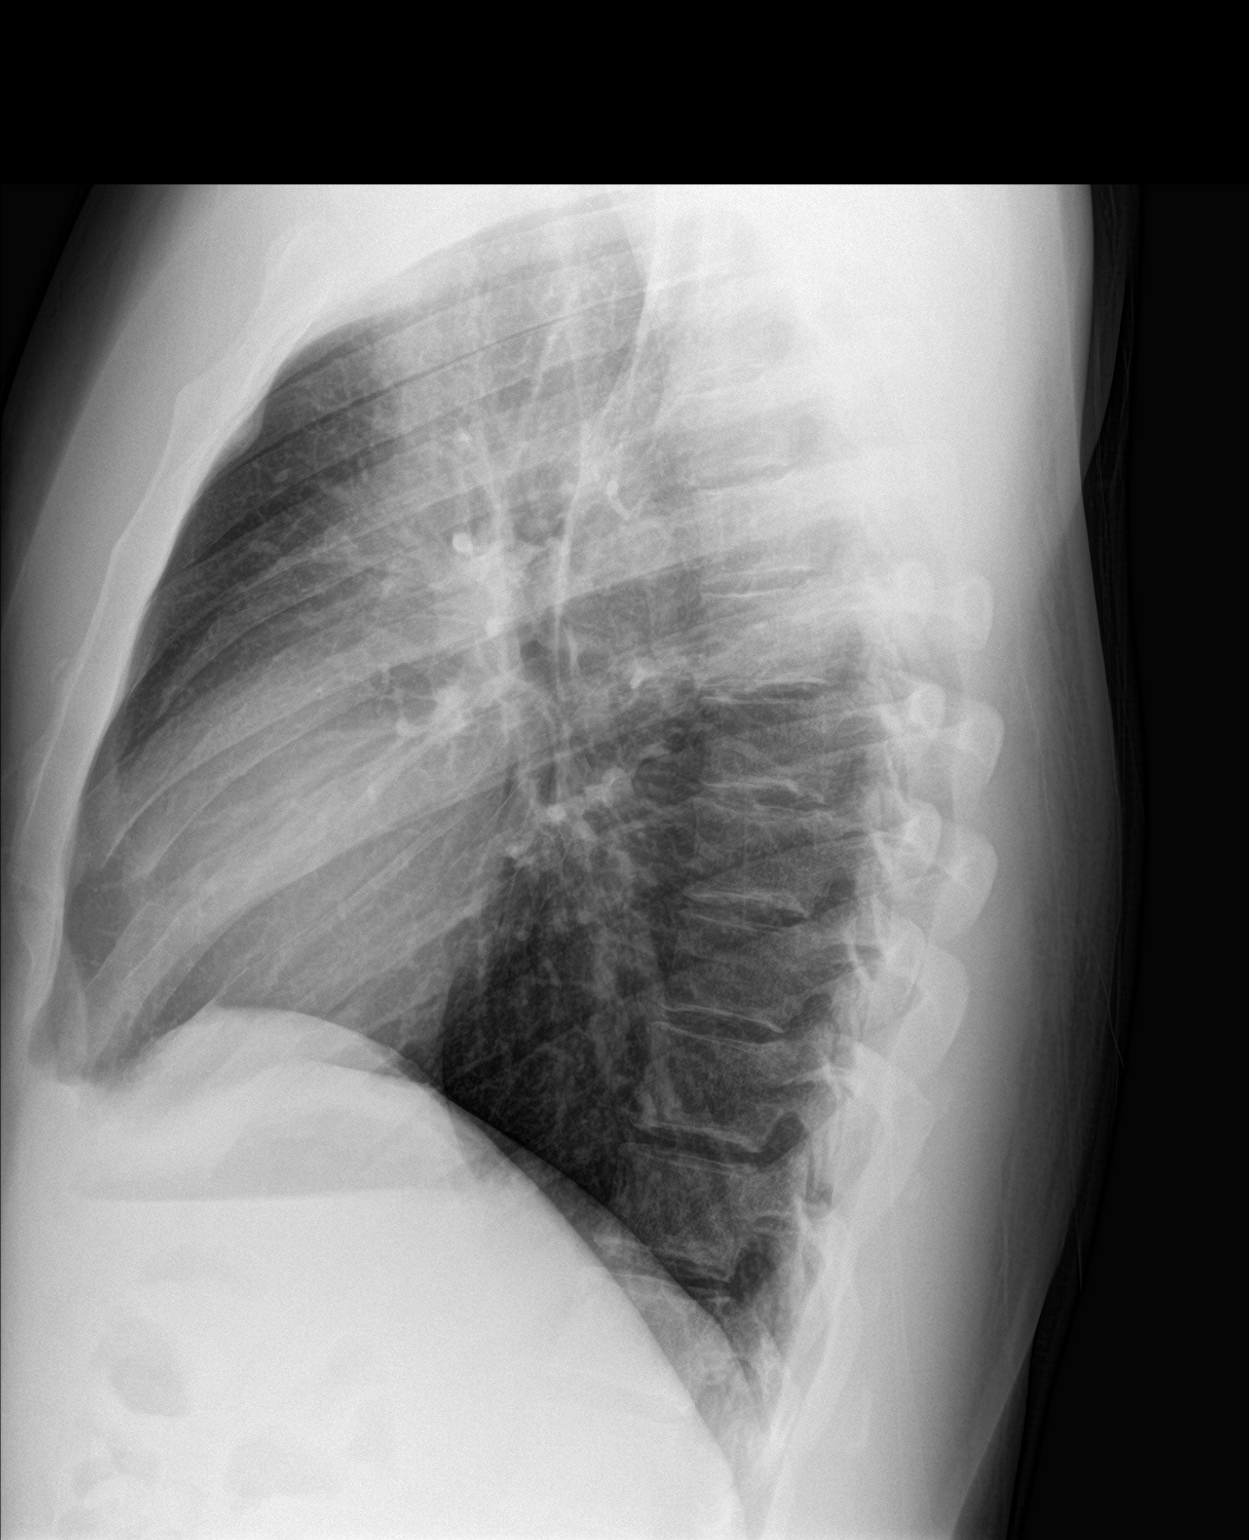

[2 of 2 positions shown; findings below may reference images not displayed]

FINDINGS: The cardiomediastinal silhouette is within normal limits. No pleural
effusion. No pneumothorax. No mass or consolidation. No acute
osseous abnormality.
IMPRESSION: No acute findings in the chest.

## 2022-01-24 ENCOUNTER — Encounter (HOSPITAL_BASED_OUTPATIENT_CLINIC_OR_DEPARTMENT_OTHER): Payer: Self-pay

## 2022-01-24 ENCOUNTER — Emergency Department (HOSPITAL_BASED_OUTPATIENT_CLINIC_OR_DEPARTMENT_OTHER): Payer: Managed Care, Other (non HMO)

## 2022-01-24 ENCOUNTER — Other Ambulatory Visit: Payer: Self-pay

## 2022-01-24 ENCOUNTER — Emergency Department (HOSPITAL_BASED_OUTPATIENT_CLINIC_OR_DEPARTMENT_OTHER)
Admission: EM | Admit: 2022-01-24 | Discharge: 2022-01-24 | Disposition: A | Discharge status: Home / Self Care | Payer: Managed Care, Other (non HMO) | Attending: Emergency Medicine | Admitting: Emergency Medicine

## 2022-01-24 DIAGNOSIS — N50811 Right testicular pain: Secondary | ICD-10-CM | POA: Insufficient documentation

## 2022-01-24 LAB — URINALYSIS, ROUTINE W REFLEX MICROSCOPIC
Bilirubin Urine: NEGATIVE
Glucose, UA: NEGATIVE mg/dL
Hgb urine dipstick: NEGATIVE
Ketones, ur: NEGATIVE mg/dL
Leukocytes,Ua: NEGATIVE
Nitrite: NEGATIVE
Protein, ur: NEGATIVE mg/dL
Specific Gravity, Urine: 1.005 (ref 1.005–1.030)
pH: 6.5 (ref 5.0–8.0)

## 2022-01-24 NOTE — ED Triage Notes (Signed)
Patient here POV from Home.  Endorses Right Testicular Pain that began 2-3 Hours ago. Began while the Patient was lying in Bed.   No Injury/Trauma. No Dysuria or Hematuria. Some Nausea.   NAD Noted during Triage. A&Ox4. GCS 15. Ambulatory.

## 2022-01-24 NOTE — ED Provider Notes (Signed)
[facility] EMERGENCY DEPT Provider Note   CSN: [id number] Arrival date & time: 01/24/22  1424     History  Chief Complaint  Patient presents with   Testicle Pain    Jack Buchanan is a 21 y.o. male.   Testicle Pain  Patient presents with right-sided testicle pain.  Began 2 to 3 hours ago.  Began acutely while he was lying in bed.  No trauma.  States when he does lift however it will sometimes get some pain.  States when he strains the testicle will go up.  States he has been urinating a little more frequently.  No trauma.  No penile discharge.     Home Medications Prior to Admission medications   Medication Sig Start Date End Date Taking? Authorizing Provider  hydrOXYzine (ATARAX) 50 MG tablet TAKE 1 TABLET (50 MG TOTAL) BY MOUTH DAILY AS NEEDED (SLEEP). 09/10/21   Jarold Motto, PA  ondansetron (ZOFRAN) 4 MG tablet Take 1 tablet (4 mg total) by mouth every 8 (eight) hours as needed for nausea or vomiting. 08/14/21   Jarold Motto, PA      Allergies    Motrin [ibuprofen]    Review of Systems   Review of Systems  Genitourinary:  Positive for testicular pain.    Physical Exam Updated Vital Signs BP (!) 154/82 (BP Location: Right Arm)   Pulse 83   Temp 98.3 F (36.8 C)   Resp 18   Ht 5\' 11"  (1.803 m)   Wt 99.8 kg   SpO2 100%   BMI 30.69 kg/m  Physical Exam Vitals reviewed.  Abdominal:     Tenderness: There is no abdominal tenderness.  Genitourinary:    Comments: No testicular tenderness.  The lie of the right testicle is somewhat higher than the left but does not have specific tenderness or mass. Musculoskeletal:        General: Normal range of motion.  Skin:    General: Skin is warm.  Neurological:     Mental Status: He is alert and oriented to person, place, and time.     ED Results / Procedures / Treatments   Labs (all labs ordered are listed, but only abnormal results are displayed) Labs Reviewed  URINALYSIS, ROUTINE W REFLEX  MICROSCOPIC - Abnormal; Notable for the following components:      Result Value   Color, Urine COLORLESS (*)    All other components within normal limits    EKG None  Radiology SCROTUM DOPPLER  Result Date: 01/24/2022 CLINICAL DATA:  Right testicular pain for 2-3 hours. EXAM: SCROTAL ULTRASOUND DOPPLER ULTRASOUND OF THE TESTICLES TECHNIQUE: Complete ultrasound examination of the testicles, epididymis, and other scrotal structures was performed. Color and spectral Doppler ultrasound were also utilized to evaluate blood flow to the testicles. COMPARISON:  None Available. FINDINGS: Right testicle Measurements: 4.7 x 2.5 x 3.3 cm. No mass or microlithiasis visualized. Left testicle Measurements: 4.8 x 2.4 x 3.2 cm. No mass or microlithiasis visualized. Right epididymis:  4 mm epididymal cyst. Left epididymis:  Normal in size and appearance. Hydrocele:  Small bilateral hydrocele. Varicocele:  None visualized. Pulsed Doppler interrogation of both testes demonstrates normal low resistance arterial and venous waveforms bilaterally. IMPRESSION: Normal testicular ultrasound. Electronically Signed   By: 01/26/2022 M.D.   On: 01/24/2022 15:44    Procedures Procedures    Medications Ordered in ED Medications - No data to display  ED Course/ Medical Decision Making/ A&P  Medical Decision Making  Patient with testicle pain.  Right-sided.  Began acutely.  No discharge.  Only mild tenderness.  Urine reassuring.  No hematuria.  Will get ultrasound.  Most worrisome potential diagnosis is testicular torsion.  No hernia was palpated.  Ultrasound negative.  Urine does not show infection or blood.  Benign exam.  Discussed with patient and will have outpatient follow-up with urology.  Does not appear to be a torsion or infection at this time.  No specific tenderness such as at site of the epididymis.  Will discharge home.        Final Clinical Impression(s) / ED  Diagnoses Final diagnoses:  Pain in right testicle    Rx / DC Orders ED Discharge Orders     None         Benjiman Core, MD 01/24/22 1639

## 2022-01-24 NOTE — Discharge Instructions (Addendum)
The urine and ultrasound was reassuring for cause of the pain.  Follow-up with urology.

## 2022-01-24 NOTE — ED Notes (Signed)
Patient verbalizes understanding of discharge instructions. Opportunity for questioning and answers were provided. Patient discharged from ED.  °

## 2022-01-24 NOTE — ED Notes (Signed)
Pt taken to Ultrasound.

## 2022-01-25 ENCOUNTER — Emergency Department (HOSPITAL_BASED_OUTPATIENT_CLINIC_OR_DEPARTMENT_OTHER): Payer: Managed Care, Other (non HMO)

## 2022-01-25 ENCOUNTER — Emergency Department (HOSPITAL_BASED_OUTPATIENT_CLINIC_OR_DEPARTMENT_OTHER)
Admission: EM | Admit: 2022-01-25 | Discharge: 2022-01-25 | Disposition: A | Discharge status: Home / Self Care | Payer: Managed Care, Other (non HMO) | Attending: Emergency Medicine | Admitting: Emergency Medicine

## 2022-01-25 ENCOUNTER — Other Ambulatory Visit (HOSPITAL_BASED_OUTPATIENT_CLINIC_OR_DEPARTMENT_OTHER): Payer: Self-pay

## 2022-01-25 ENCOUNTER — Encounter (HOSPITAL_BASED_OUTPATIENT_CLINIC_OR_DEPARTMENT_OTHER): Payer: Self-pay

## 2022-01-25 DIAGNOSIS — N451 Epididymitis: Secondary | ICD-10-CM | POA: Diagnosis not present

## 2022-01-25 DIAGNOSIS — R1031 Right lower quadrant pain: Secondary | ICD-10-CM | POA: Diagnosis present

## 2022-01-25 LAB — CBC WITH DIFFERENTIAL/PLATELET
Abs Immature Granulocytes: 0.03 10*3/uL (ref 0.00–0.07)
Basophils Absolute: 0 10*3/uL (ref 0.0–0.1)
Basophils Relative: 0 %
Eosinophils Absolute: 0.1 10*3/uL (ref 0.0–0.5)
Eosinophils Relative: 1 %
HCT: 46 % (ref 39.0–52.0)
Hemoglobin: 16.5 g/dL (ref 13.0–17.0)
Immature Granulocytes: 0 %
Lymphocytes Relative: 30 %
Lymphs Abs: 2.6 10*3/uL (ref 0.7–4.0)
MCH: 30.7 pg (ref 26.0–34.0)
MCHC: 35.9 g/dL (ref 30.0–36.0)
MCV: 85.7 fL (ref 80.0–100.0)
Monocytes Absolute: 0.7 10*3/uL (ref 0.1–1.0)
Monocytes Relative: 8 %
Neutro Abs: 5.4 10*3/uL (ref 1.7–7.7)
Neutrophils Relative %: 61 %
Platelets: 255 10*3/uL (ref 150–400)
RBC: 5.37 MIL/uL (ref 4.22–5.81)
RDW: 11.9 % (ref 11.5–15.5)
WBC: 8.9 10*3/uL (ref 4.0–10.5)
nRBC: 0 % (ref 0.0–0.2)

## 2022-01-25 LAB — COMPREHENSIVE METABOLIC PANEL
ALT: 30 U/L (ref 0–44)
AST: 25 U/L (ref 15–41)
Albumin: 5.1 g/dL — ABNORMAL HIGH (ref 3.5–5.0)
Alkaline Phosphatase: 57 U/L (ref 38–126)
Anion gap: 12 (ref 5–15)
BUN: 15 mg/dL (ref 6–20)
CO2: 25 mmol/L (ref 22–32)
Calcium: 9.9 mg/dL (ref 8.9–10.3)
Chloride: 100 mmol/L (ref 98–111)
Creatinine, Ser: 0.94 mg/dL (ref 0.61–1.24)
GFR, Estimated: >60 mL/min (ref >60)
Glucose, Bld: 96 mg/dL (ref 70–99)
Potassium: 3.9 mmol/L (ref 3.5–5.1)
Sodium: 137 mmol/L (ref 135–145)
Total Bilirubin: 0.9 mg/dL (ref 0.3–1.2)
Total Protein: 7.7 g/dL (ref 6.5–8.1)

## 2022-01-25 LAB — URINALYSIS, ROUTINE W REFLEX MICROSCOPIC
Bilirubin Urine: NEGATIVE
Glucose, UA: NEGATIVE mg/dL
Hgb urine dipstick: NEGATIVE
Ketones, ur: NEGATIVE mg/dL
Leukocytes,Ua: NEGATIVE
Nitrite: NEGATIVE
Protein, ur: NEGATIVE mg/dL
Specific Gravity, Urine: 1.014 (ref 1.005–1.030)
pH: 5.5 (ref 5.0–8.0)

## 2022-01-25 LAB — LIPASE, BLOOD: Lipase: 17 U/L (ref 11–51)

## 2022-01-25 MED ORDER — DOXYCYCLINE HYCLATE 100 MG PO CAPS
100.0000 mg | ORAL_CAPSULE | Freq: Two times a day (BID) | ORAL | 0 refills | Status: AC
  Filled 2022-01-25: qty 20, 10d supply, fill #0

## 2022-01-25 MED ORDER — CEFTRIAXONE SODIUM 500 MG IJ SOLR
500.0000 mg | Freq: Once | INTRAMUSCULAR | Status: AC
  Administered 2022-01-25: 500 mg via INTRAMUSCULAR
  Filled 2022-01-25: qty 500

## 2022-01-25 MED ORDER — LIDOCAINE HCL (PF) 1 % IJ SOLN
INTRAMUSCULAR | Status: AC
  Administered 2022-01-25: 1 mL via INTRAMUSCULAR
  Filled 2022-01-25: qty 5

## 2022-01-25 MED ORDER — DOXYCYCLINE HYCLATE 100 MG PO TABS
100.0000 mg | ORAL_TABLET | Freq: Once | ORAL | Status: AC
  Administered 2022-01-25: 100 mg via ORAL
  Filled 2022-01-25: qty 1

## 2022-01-25 MED ORDER — FENTANYL CITRATE PF 50 MCG/ML IJ SOSY: 50.0000 ug | PREFILLED_SYRINGE | INTRAMUSCULAR | Status: DC | PRN

## 2022-01-25 MED ORDER — ACETAMINOPHEN 325 MG PO TABS
650.0000 mg | ORAL_TABLET | Freq: Once | ORAL | Status: DC
  Filled 2022-01-25: qty 2

## 2022-01-25 MED ORDER — ONDANSETRON HCL 4 MG/2ML IJ SOLN: 4.0000 mg | Freq: Once | INTRAMUSCULAR | Status: DC

## 2022-01-25 NOTE — ED Provider Notes (Signed)
[facility] EMERGENCY DEPT Provider Note   CSN: [id number] Arrival date & time: 01/25/22  0947     History  Chief Complaint  Patient presents with   Testicle Pain    Jack Buchanan is a 21 y.o. male.  Ongoing testicular pain.  Was seen here yesterday for the same.  Nothing makes it worse or better.  Denies any fevers or chills.  Denies any discharge.  Denies any trauma.  No chest pain or abdominal pain or nausea or vomiting.  Overall he states that pain is fairly constant.  He denies any painful urination.  Pain also go from his testicle up into his groin area.  Denies any history of kidney stone.  No major abdominal surgery history.  Nothing makes it worse or better.  The history is provided by the patient.       Home Medications Prior to Admission medications   Medication Sig Start Date End Date Taking? Authorizing Provider  doxycycline (VIBRAMYCIN) 100 MG capsule Take 1 capsule (100 mg total) by mouth 2 (two) times daily for 10 days. 01/25/22 02/04/22 Yes Hermine Feria, DO  hydrOXYzine (ATARAX) 50 MG tablet TAKE 1 TABLET (50 MG TOTAL) BY MOUTH DAILY AS NEEDED (SLEEP). 09/10/21   Jarold Motto, PA  ondansetron (ZOFRAN) 4 MG tablet Take 1 tablet (4 mg total) by mouth every 8 (eight) hours as needed for nausea or vomiting. 08/14/21   Jarold Motto, PA      Allergies    Motrin [ibuprofen]    Review of Systems   Review of Systems  Physical Exam Updated Vital Signs BP 138/85 (BP Location: Right Arm)   Pulse 79   Temp 98.3 F (36.8 C)   Resp 18   SpO2 100%  Physical Exam Vitals and nursing note reviewed. Exam conducted with a chaperone present.  Constitutional:      General: He is not in acute distress.    Appearance: He is well-developed. He is not ill-appearing.  HENT:     Head: Normocephalic and atraumatic.  Eyes:     Extraocular Movements: Extraocular movements intact.     Conjunctiva/sclera: Conjunctivae normal.     Pupils: Pupils are equal,  round, and reactive to light.  Cardiovascular:     Rate and Rhythm: Normal rate and regular rhythm.     Pulses: Normal pulses.     Heart sounds: No murmur heard. Pulmonary:     Effort: Pulmonary effort is normal. No respiratory distress.     Breath sounds: Normal breath sounds.  Abdominal:     Palpations: Abdomen is soft.     Tenderness: There is abdominal tenderness.     Hernia: No hernia is present.  Genitourinary:    Penis: Normal.      Comments: Testicles overall look normal with no swelling or erythema, he is tender to the right testicle as well as the right inguinal area Musculoskeletal:        General: No swelling.     Cervical back: Neck supple.  Skin:    General: Skin is warm and dry.     Capillary Refill: Capillary refill takes less than 2 seconds.  Neurological:     General: No focal deficit present.     Mental Status: He is alert.  Psychiatric:        Mood and Affect: Mood normal.     ED Results / Procedures / Treatments   Labs (all labs ordered are listed, but only abnormal results are displayed) Labs Reviewed  COMPREHENSIVE METABOLIC PANEL - Abnormal; Notable for the following components:      Result Value   Albumin 5.1 (*)    All other components within normal limits  CBC WITH DIFFERENTIAL/PLATELET  LIPASE, BLOOD  URINALYSIS, ROUTINE W REFLEX MICROSCOPIC  GC/CHLAMYDIA PROBE AMP ([place]) NOT AT [facility]    EKG None  Radiology US SCROTUM W/DOPPLER  Result Date: 01/25/2022 CLINICAL DATA:  21 year old male with right testicular pain for 1 day. EXAM: SCROTAL ULTRASOUND DOPPLER ULTRASOUND OF THE TESTICLES TECHNIQUE: Complete ultrasound examination of the testicles, epididymis, and other scrotal structures was performed. Color and spectral Doppler ultrasound were also utilized to evaluate blood flow to the testicles. COMPARISON:  Noncontrast CT Abdomen and Pelvis 1106 hours today. Scrotal ultrasound with Doppler yesterday. FINDINGS: Right testicle  Measurements: 4.3 x 2.7 x 3.1 cm. Stable. No mass or microlithiasis visualized. Left testicle Measurements: 4.6 x 2.4 x 2.9 cm. No mass or microlithiasis visualized. Right epididymis: Grayscale appearance mildly heterogeneous. Questionable hypervascularity on Doppler today (image 14), although seemingly normal on image 24. Left epididymis: Normal in size and appearance. No hypervascularity. Hydrocele:  None visualized. Varicocele:  None visualized. Pulsed Doppler interrogation of both testes demonstrates normal low resistance arterial and venous waveforms bilaterally. IMPRESSION: Difficult to exclude right side epididymitis. But otherwise stable and negative scrotal ultrasound with Doppler. No evidence of testicular mass or torsion. Electronically Signed   By: Odessa Fleming M.D.   On: 01/25/2022 11:28   CT Renal Stone Study  Result Date: 01/25/2022 CLINICAL DATA:  Flank pain. EXAM: CT ABDOMEN AND PELVIS WITHOUT CONTRAST TECHNIQUE: Multidetector CT imaging of the abdomen and pelvis was performed following the standard protocol without IV contrast. RADIATION DOSE REDUCTION: This exam was performed according to the departmental dose-optimization program which includes automated exposure control, adjustment of the mA and/or kV according to patient size and/or use of iterative reconstruction technique. COMPARISON:  CT abdomen pelvis dated January 23, 2012. FINDINGS: Lower chest: No acute abnormality. Hepatobiliary: No focal liver abnormality is seen. No gallstones, gallbladder wall thickening, or biliary dilatation. Pancreas: Unremarkable. No pancreatic ductal dilatation or surrounding inflammatory changes. Spleen: Normal in size without focal abnormality. Adrenals/Urinary Tract: Adrenal glands are unremarkable. Kidneys are normal, without renal calculi, focal lesion, or hydronephrosis. Bladder is unremarkable. Stomach/Bowel: Stomach is within normal limits. Appendix appears normal. No evidence of bowel wall thickening,  distention, or inflammatory changes. Vascular/Lymphatic: No significant vascular findings are present. No enlarged abdominal or pelvic lymph nodes. Reproductive: Prostate is unremarkable. Other: No abdominal wall hernia or abnormality. No abdominopelvic ascites. No pneumoperitoneum. Musculoskeletal: No acute or significant osseous findings. IMPRESSION: 1. No acute intra-abdominal process. Electronically Signed   By: Obie Dredge M.D.   On: 01/25/2022 11:13   US SCROTUM DOPPLER  Result Date: 01/24/2022 CLINICAL DATA:  Right testicular pain for 2-3 hours. EXAM: SCROTAL ULTRASOUND DOPPLER ULTRASOUND OF THE TESTICLES TECHNIQUE: Complete ultrasound examination of the testicles, epididymis, and other scrotal structures was performed. Color and spectral Doppler ultrasound were also utilized to evaluate blood flow to the testicles. COMPARISON:  None Available. FINDINGS: Right testicle Measurements: 4.7 x 2.5 x 3.3 cm. No mass or microlithiasis visualized. Left testicle Measurements: 4.8 x 2.4 x 3.2 cm. No mass or microlithiasis visualized. Right epididymis:  4 mm epididymal cyst. Left epididymis:  Normal in size and appearance. Hydrocele:  Small bilateral hydrocele. Varicocele:  None visualized. Pulsed Doppler interrogation of both testes demonstrates normal low resistance arterial and venous waveforms bilaterally. IMPRESSION: Normal testicular ultrasound. Electronically Signed  By: Fidela Salisbury M.D.   On: 01/24/2022 15:44    Procedures Procedures    Medications Ordered in ED Medications  acetaminophen (TYLENOL) tablet 650 mg (650 mg Oral Patient Refused/Not Given 01/25/22 1103)  cefTRIAXone (ROCEPHIN) injection 500 mg (has no administration in time range)  doxycycline (VIBRA-TABS) tablet 100 mg (has no administration in time range)    ED Course/ Medical Decision Making/ A&P                           Medical Decision Making Amount and/or Complexity of Data Reviewed Labs: ordered. Radiology:  ordered.  Risk OTC drugs. Prescription drug management.   Jake Bathe is here with right lower abdominal pain/testicular pain on the right.  Normal vitals.  No fever.  He was seen here yesterday with normal ultrasound of his testicles.  Differential diagnosis is kidney stone versus muscular process versus hernia versus less likely torsion/epididymitis.  We will repeat testicular ultrasound as he still fairly tender in this area but clinically does not seem to have torsion.  Pain has been fairly constant.  We will get a CT scan to further rule out kidney stone or hernia or appendicitis.  We will check basic labs.  CT scan per radiology report shows no acute findings.  Lab work per my review and interpretation shows no significant anemia, electrolyte abnormality, kidney injury.  Ultrasound today shows may be right epididymitis.  No evidence of torsion.  Given that he is tender we will treat with antibiotics.  He is in a monogamous relationship.  We will treat with Rocephin and doxycycline however otherwise.  Understands return precautions.  We will test for gonorrhea and chlamydia.  Recommend follow-up with urology.  Recommend wearing supportive underwear.  Recommend Tylenol for pain.  This chart was dictated using voice recognition software.  Despite best efforts to proofread,  errors can occur which can change the documentation meaning.         Final Clinical Impression(s) / ED Diagnoses Final diagnoses:  Acute epididymitis    Rx / DC Orders ED Discharge Orders          Ordered    doxycycline (VIBRAMYCIN) 100 MG capsule  2 times daily        01/25/22 1138              Jonaya Freshour, [place], DO 01/25/22 1139

## 2022-01-25 NOTE — Discharge Instructions (Addendum)
Take next dose antibiotic tonight.  Wear supportive underwear.  This should improve with antibiotics.  Follow-up your gonorrhea and Chlamydia test on your MyChart.  Results likely tomorrow or later this week.  Epididymitis sometimes is caused by bacteria or inflammation.  You are treated with antibiotics.

## 2022-01-25 NOTE — ED Notes (Signed)
Patient refuses Tylenol at this time. Encouraged to let me know if pain worsened and he wants the Tylenol. Ambulated to CT with tech.

## 2022-01-25 NOTE — ED Triage Notes (Signed)
Pt here from home, seen here yesterday for the same.  Pt reports Right testicular pain x24 hours, seen here yesterday for the same, [place] and UA were both negative, pt reports symptoms are worse today. Pt feels his Right testicle is more swollen than his Left testicle and that is different from yesterday.   Pt denies any abscess or injury, pt states the pain was sudden while sitting in his room.  Pt denies burning with urination, penile discharge, no color change, no redness. Pt denies frequency, urgency, slow stream or interruption to his urine stream.  Pt states the pain starts the pain is just lateral to his groin area and radiates down into his testicle.  Pt is sexually active with same partner, does not wear condoms.  Pt states the pain moves when he sits

## 2022-01-28 LAB — GC/CHLAMYDIA PROBE AMP (~~LOC~~) NOT AT ARMC
Chlamydia: NEGATIVE
Comment: NEGATIVE
Comment: NORMAL
Neisseria Gonorrhea: NEGATIVE

## 2022-02-04 ENCOUNTER — Encounter: Payer: Self-pay | Admitting: *Deleted

## 2022-02-21 ENCOUNTER — Ambulatory Visit (HOSPITAL_COMMUNITY)
Admission: RE | Admit: 2022-02-21 | Discharge: 2022-02-21 | Disposition: A | Discharge status: Home / Self Care | Payer: Managed Care, Other (non HMO) | Source: Ambulatory Visit | Attending: Physician Assistant | Admitting: Physician Assistant

## 2022-02-21 ENCOUNTER — Encounter: Payer: Self-pay | Admitting: Physician Assistant

## 2022-02-21 ENCOUNTER — Ambulatory Visit: Payer: Managed Care, Other (non HMO) | Admitting: Physician Assistant

## 2022-02-21 VITALS — BP 118/80 | HR 78 | Temp 98.0°F | Ht 71.0 in | Wt 245.0 lb

## 2022-02-21 DIAGNOSIS — R1031 Right lower quadrant pain: Secondary | ICD-10-CM

## 2022-02-21 DIAGNOSIS — N50812 Left testicular pain: Secondary | ICD-10-CM

## 2022-02-21 NOTE — Patient Instructions (Signed)
It was great to see you!  We are going to ultrasound the area and your testicle  I will be in touch with results  Possibly see Sports Medicine if ultrasound is inconclusive  Take care,  Inda Coke PA-C

## 2022-02-21 NOTE — Progress Notes (Addendum)
Jack Buchanan is a 21 y.o. male here for a new problem.  History of Present Illness:   Chief Complaint  Patient presents with   Testicle Pain    Pt c/o right testicle pain x 1 month and now pain has moved up to right groin area and there is a ball there, tender. Left testicle started hurting yesterday.    HPI  Patient is here with his mother during today's visit.   R groin pain and L testicular pain He went to the ED on 01/24/2022 due to his right testicle pain. He had an ultrasound at the ED, which was normal, and urine did not show infection or blood. He went back to the ED on 01/25/2022 where he had an CT scan with no abnormal results. He had Rocephin injection 500 mg there and was prescribed doxycycline 100 mg BID and took this for 10 days.  Also, recommended to see urology due to possibility of acute epididymitis. He went to the urologist recently and stated everything was normal and diagnosed him with acute non-bacterial epididymitis. His urologist told the patient to discontinue Doxycycline 100 mg but patient continued.  He reports that he has a lump in his right groin, which is why he went to the ED. He complains of left testicle pain during today's visit. Left testicle pain started yesterday on 02/20/2022. He denies any problems with urination. He is scheduled for a follow-up with urologist later today.   At onset of symptoms a month ago he was lifting weights regularly. He has stopped this completely since all of this has started.  Past Medical History:  Diagnosis Date   ADD (attention deficit disorder)      Social History   Tobacco Use   Smoking status: Never   Smokeless tobacco: Never  Substance Use Topics   Alcohol use: Yes    Comment: very rare   Drug use: Never    Past Surgical History:  Procedure Laterality Date   GUM SURGERY     tubes in ears      Family History  Problem Relation Age of Onset   Diabetes Mother    Skin cancer Mother    Depression Mother     Bipolar disorder Mother    Diabetes Father     Allergies  Allergen Reactions   Motrin [Ibuprofen] Anaphylaxis    Current Medications:  No current outpatient medications on file.   Review of Systems:   Review of Systems  Genitourinary:        (+) Bilateral testicle pain     Vitals:   Vitals:   02/21/22 1134  BP: 118/80  Pulse: 78  Temp: 98 F (36.7 C)  TempSrc: Temporal  SpO2: 97%  Weight: 245 lb (111.1 kg)  Height: 5\' 11"  (1.803 m)     Body mass index is 34.17 kg/m.  Physical Exam:   Physical Exam Exam conducted with a chaperone present.  Constitutional:      General: He is not in acute distress.    Appearance: Normal appearance. He is not ill-appearing.  HENT:     Head: Normocephalic and atraumatic.     Right Ear: External ear normal.     Left Ear: External ear normal.  Eyes:     Extraocular Movements: Extraocular movements intact.     Pupils: Pupils are equal, round, and reactive to light.  Cardiovascular:     Rate and Rhythm: Normal rate and regular rhythm.     Heart sounds: Normal  heart sounds. No murmur heard.    No gallop.  Pulmonary:     Effort: Pulmonary effort is normal. No respiratory distress.     Breath sounds: Normal breath sounds. No wheezing or rales.  Genitourinary:    Comments: Palpable nodule in medial R groin without TTP Testicular exam WNL Skin:    General: Skin is warm and dry.  Neurological:     Mental Status: He is alert and oriented to person, place, and time.  Psychiatric:        Judgment: Judgment normal.     Assessment and Plan:   Right groin pain Will obtain pelvic u/s to evaluate the area of concern If this is inconclusive, I will refer to Dr Clementeen Graham in Sports Medicine for further evaluation and management  Pain in left testicle Will obtain another scrotal u/s for further evaluation May need close follow-up with urology if any persistent sx and depending on findings  I,Param Shah,acting as a scribe for  [employer], PA.,have documented all relevant documentation on the behalf of Jarold Motto, PA,as directed by  Jarold Motto, PA while in the presence of Jarold Motto, [name].  I, Jarold Motto, [name], have reviewed all documentation for this visit. The documentation on 02/21/22 for the exam, diagnosis, procedures, and orders are all accurate and complete.   Jarold Motto, PA-C

## 2022-05-22 ENCOUNTER — Other Ambulatory Visit: Payer: Self-pay | Admitting: Physician Assistant

## 2022-05-22 ENCOUNTER — Other Ambulatory Visit: Payer: Self-pay | Admitting: *Deleted

## 2022-05-22 ENCOUNTER — Encounter: Payer: Self-pay | Admitting: Physician Assistant

## 2022-05-22 NOTE — Telephone Encounter (Signed)
Okay to change inhaler per pharmacy. Pt sent message for refills on inhalers.

## 2023-03-31 ENCOUNTER — Encounter: Payer: Self-pay | Admitting: Physician Assistant

## 2023-03-31 ENCOUNTER — Ambulatory Visit: Payer: Managed Care, Other (non HMO) | Admitting: Physician Assistant

## 2023-03-31 VITALS — BP 115/78 | HR 80 | Temp 97.1°F | Ht 71.0 in | Wt 266.0 lb

## 2023-03-31 DIAGNOSIS — R1031 Right lower quadrant pain: Secondary | ICD-10-CM | POA: Diagnosis not present

## 2023-03-31 DIAGNOSIS — M25561 Pain in right knee: Secondary | ICD-10-CM

## 2023-03-31 DIAGNOSIS — M25512 Pain in left shoulder: Secondary | ICD-10-CM | POA: Diagnosis not present

## 2023-03-31 DIAGNOSIS — F200 Paranoid schizophrenia: Secondary | ICD-10-CM

## 2023-03-31 DIAGNOSIS — G8929 Other chronic pain: Secondary | ICD-10-CM

## 2023-03-31 NOTE — Patient Instructions (Signed)
    03/31/2023    1:48 PM 08/14/2021   11:41 AM 12/21/2020   10:38 AM  Depression screen PHQ 2/9  Decreased Interest 2 2 0  Down, Depressed, Hopeless 2 2 0  PHQ - 2 Score 4 4 0  Altered sleeping 2 3   Tired, decreased energy 2 2   Change in appetite 2 2   Feeling bad or failure about yourself  2 2   Trouble concentrating 1 2   Moving slowly or fidgety/restless 1 2   Suicidal thoughts 0 0   PHQ-9 Score 14 17   Difficult doing work/chores Not difficult at all        03/31/2023    1:48 PM 08/14/2021   11:40 AM  GAD 7 : Generalized Anxiety Score  Nervous, Anxious, on Edge 2 3  Control/stop worrying 1 3  Worry too much - different things 1 3  Trouble relaxing 1 2  Restless 0 2  Easily annoyed or irritable 2 2  Afraid - awful might happen 1 2  Total GAD 7 Score 8 17  Anxiety Difficulty Somewhat difficult

## 2023-03-31 NOTE — Progress Notes (Signed)
Jack Buchanan is a 22 y.o. male here for a new problem.  History of Present Illness:   Chief Complaint  Patient presents with   Knee Pain    Pt c/o pain right knee, started a week ago, has not taken any medication, did try ice.   Shoulder Pain    Pt c/o left shoulder pain, started several months, applying ice.   HPI  Knee Pain (Right): Complains of right knee pain starting a month ago. Reports that he has to jump down from his work [employer] a couple of times a day. Describes severity of pain has remained the same;   Can't rate 1-10 but notes that at its worst pain if he crouches down he can't stand back up.  Endorses tenderness on palpation to frontal aspect of patella and swelling of knee.  States he's been icing the area.  Expresses that he's willing to see Sports Medicine.  Cannot take NSAIDs - cause anaphylaxis. Cannot take tylenol - causes headache(s).   Shoulder Pain (Left): Complains of constant moderate-severe left shoulder pain for the past several months.  Expresses that he's pretty sure he injured himself while doing bench presses. Endorses his ROM is affected (causes pain on certain movements) but he does have full range, as well as mild tenderness on palpation.  States he's been using ice and compression.  Notes that he's reduced his exercise, but still goes to the gym and his work is still physically demanding.   Groin Pain (Right): Reports he's still experiencing intermittent severe pain in his groin and right testicle.  Would like to also talk about this with Sports Medicine.   Paranoid Schizophrenia: Reports that occasionally when talking he sometimes feels like speech becomes difficult and notes his girlfriend does sometimes notice differences in his speech.  Notes that occasionally he's thinking intelligently but his speech will be nonsensical. Endorses increased irritability, fatigue, and insomnia.  Notes he does experience some auditory hallucinations (camera  clicking and other sounds) and possibly mild visual hallucinations (things looking abnormal) Hx of OCD.  Endorses mild obsessive-compulsive behaviors daily, but not as severe as when he was younger. Notes that his obsessive behaviors surround the women in his life: compulsions to hurt himself that will allow him to see his partners or keep them safe.   States he tried therapy when he was younger which didn't help very much.  Reports that he doesn't sleep much (not on purpose, but he does sometimes experience time blindness while awake during the night). He doesn't drink caffeine regularly, maybe an energy drink once a week.  Denies self-harming behaviors currently.   Past Medical History:  Diagnosis Date   ADD (attention deficit disorder)     Social History   Tobacco Use   Smoking status: Never   Smokeless tobacco: Never  Substance Use Topics   Alcohol use: Yes    Comment: very rare   Drug use: Never   Past Surgical History:  Procedure Laterality Date   GUM SURGERY     tubes in ears     Family History  Problem Relation Age of Onset   Diabetes Mother    Skin cancer Mother    Depression Mother    Bipolar disorder Mother    Diabetes Father    Allergies  Allergen Reactions   Motrin [Ibuprofen] Anaphylaxis   Current Medications:   Current Outpatient Medications:    albuterol (VENTOLIN HFA) 108 (90 Base) MCG/ACT inhaler, TAKE 2 PUFFS BY MOUTH EVERY 6  HOURS AS NEEDED FOR WHEEZE OR SHORTNESS OF BREATH, Disp: 8.5 each, Rfl: 2  Review of Systems:   ROS See pertinent positives and negatives as per the HPI.  Vitals:   Vitals:   03/31/23 1343  BP: 115/78  Pulse: 80  Temp: (!) 97.1 F (36.2 C)  TempSrc: Temporal  SpO2: 97%  Weight: 266 lb (120.7 kg)  Height: 5\' 11"  (1.803 m)     Body mass index is 37.1 kg/m.  Physical Exam:   Physical Exam Vitals and nursing note reviewed.  Constitutional:      Appearance: He is well-developed.  HENT:     Head: Normocephalic.   Eyes:     Conjunctiva/sclera: Conjunctivae normal.     Pupils: Pupils are equal, round, and reactive to light.  Pulmonary:     Effort: Pulmonary effort is normal.  Musculoskeletal:        General: Normal range of motion.     Cervical back: Normal range of motion.     Comments: Tenderness to palpation to tibial tuberosity of right knee Normal ROM of right knee  Significant pain elicited with placing left arm behind back Tenderness to palpation to anterior aspect of left shoulder Normal range of motion with abduction and adduction  Skin:    General: Skin is warm and dry.  Neurological:     Mental Status: He is alert and oriented to person, place, and time.  Psychiatric:        Behavior: Behavior normal.        Thought Content: Thought content normal.        Judgment: Judgment normal.     Assessment and Plan:   Paranoid schizophrenia (HCC) I discussed with patient that if they develop any SI, to tell someone immediately and seek medical attention. Uncontrolled Denies suicidal ideation/hi Alert and oriented today x 3  Agreeable to psychiatry referral  Right groin pain Not assessed by me -- discussed referral to urology - he declined Will refer to sports medicine to see if they can guide our treatment plan better  Chronic left shoulder pain Suspect from injury due to weight lifting Limited treatment options due to allergies, side effect(s) from tylenol and concern for potential mood disorder side effect(s) with oral prednisone Agreeable to referral to sports medicine or further evaluation and management  Declines physical therapy at this time Recommend avoiding aggressive activity in the interim  Acute pain of right knee Suspect from injury due from work (does not want to file any claim) Limited treatment options due to allergies, side effect(s) from tylenol and concern for potential mood disorder side effect(s) with oral prednisone Agreeable to referral to sports  medicine or further evaluation and management  Declines physical therapy at this time Recommend avoiding aggressive activity in the interim  Recommend follow-up with me in 3 months for Comprehensive Physical Exam (CPE) preventive care annual visit    I, Irving Burton Lagle,acting as a scribe for [employer], PA.,have documented all relevant documentation on the behalf of Jarold Motto, PA,as directed by  Jarold Motto, PA while in the presence of Jarold Motto, [name].  I, Jarold Motto, [name], have reviewed all documentation for this visit. The documentation on 03/31/23 for the exam, diagnosis, procedures, and orders are all accurate and complete.  I spent a total of 46 minutes on this visit, today 03/31/23 discussing plan of care with patient and using shared-decision making on next steps, refilling medications, and documenting the findings in the note.   Jarold Motto,  PA-C

## 2024-03-29 ENCOUNTER — Other Ambulatory Visit: Payer: Self-pay | Admitting: Physician Assistant

## 2024-03-29 NOTE — Telephone Encounter (Unsigned)
 Copied from CRM #[id number]. Topic: Clinical - Medication Refill >> Mar 29, 2024 10:33 AM Amy B wrote: Medication:  albuterol  (VENTOLIN  HFA) 108 (90 Base) MCG/ACT inhaler  Has the patient contacted their pharmacy? No (Agent: If no, request that the patient contact the pharmacy for the refill. If patient does not wish to contact the pharmacy document the reason why and proceed with request.) (Agent: If yes, when and what did the pharmacy advise?)  This is the patient's preferred pharmacy:  [id number] - [place], [place] - [address] [place] [address] [place] [address] [place] [address] Phone: [phone] Fax: [phone]  Is this the correct pharmacy for this prescription? Yes  If no, delete pharmacy and type the correct one.   Has the prescription been filled recently? No  Is the patient out of the medication? Yes  Has the patient been seen for an appointment in the last year OR does the patient have an upcoming appointment? Yes  Can we respond through MyChart? Yes  Agent: Please be advised that Rx refills may take up to 3 business days. We ask that you follow-up with your pharmacy.

## 2024-03-30 MED ORDER — ALBUTEROL SULFATE HFA 108 (90 BASE) MCG/ACT IN AERS: 2.0000 | INHALATION_SPRAY | Freq: Four times a day (QID) | RESPIRATORY_TRACT | 2 refills | Status: AC | PRN

## 2024-03-31 ENCOUNTER — Ambulatory Visit (INDEPENDENT_AMBULATORY_CARE_PROVIDER_SITE_OTHER): Admitting: Physician Assistant

## 2024-03-31 ENCOUNTER — Encounter: Payer: Self-pay | Admitting: Physician Assistant

## 2024-03-31 VITALS — BP 120/86 | HR 76 | Temp 97.9°F | Ht 71.0 in | Wt 258.4 lb

## 2024-03-31 DIAGNOSIS — R059 Cough, unspecified: Secondary | ICD-10-CM | POA: Diagnosis not present

## 2024-03-31 LAB — POC INFLUENZA A&B (BINAX/QUICKVUE)
Influenza A, POC: NEGATIVE
Influenza B, POC: NEGATIVE

## 2024-03-31 LAB — POC COVID19 BINAXNOW: SARS Coronavirus 2 Ag: NEGATIVE

## 2024-03-31 LAB — POCT RAPID STREP A (OFFICE): Rapid Strep A Screen: NEGATIVE

## 2024-03-31 MED ORDER — AZITHROMYCIN 250 MG PO TABS: ORAL_TABLET | ORAL | 0 refills | Status: AC

## 2024-03-31 NOTE — Progress Notes (Signed)
 Jack Buchanan is a 23 y.o. male here for a new problem.  History of Present Illness:   Chief Complaint  Patient presents with   Cough    Pt c/o cough and expectorating brown/orange sputum, sore throat, chills. Started on Friday   Discussed the use of AI scribe software for clinical note transcription with the patient, who gave verbal consent to proceed.  History of Present Illness   Jack Buchanan is a 23 year old male who presents with symptoms of a cold.  Symptoms began six days ago with initial worsening and improvement today. He experienced significant dyspnea, especially when supine, which has since improved. A severe sore throat was present for the first three days and has now resolved. He experienced a persistent cough and difficulty breathing, for which he considered using an inhaler but found it was empty. His girlfriend obtained a refill for him.  He took DayQuil once for his symptoms. He notes that everyone at his workplace is sick, and he believes he contracted his illness from there. He has been staying at his mother's place to avoid getting his girlfriend and their three-month-old baby sick. His mother, a engineer, civil (consulting), watches the baby during the weekdays.  No current shortness of breath, though he experienced it earlier in his illness. No current ear pain but mentions a feeling of fullness. He reports a resolved sore throat and improved cough. He notes some nasal congestion today, which he attributes to just waking up an hour ago.       Past Medical History:  Diagnosis Date   ADD (attention deficit disorder)      Social History   Tobacco Use   Smoking status: Never   Smokeless tobacco: Never  Substance Use Topics   Alcohol use: Yes    Comment: very rare   Drug use: Never    Past Surgical History:  Procedure Laterality Date   GUM SURGERY     tubes in ears      Family History  Problem Relation Age of Onset   Diabetes Mother    Skin cancer Mother    Depression Mother     Bipolar disorder Mother    Diabetes Father     Allergies  Allergen Reactions   Motrin [Ibuprofen] Anaphylaxis    Current Medications:   Current Outpatient Medications:    albuterol  (VENTOLIN  HFA) 108 (90 Base) MCG/ACT inhaler, Inhale 2 puffs into the lungs every 6 (six) hours as needed for wheezing or shortness of breath (For Wheezing)., Disp: 8.5 each, Rfl: 2   Review of Systems:   Negative unless otherwise specified per HPI.  Vitals:   Vitals:   03/31/24 0809  BP: 120/86  Pulse: 76  Temp: 97.9 F (36.6 C)  TempSrc: Temporal  SpO2: 98%  Weight: 258 lb 6.1 oz (117.2 kg)  Height: 5' 11 (1.803 m)     Body mass index is 36.04 kg/m.  Physical Exam:   Physical Exam Vitals and nursing note reviewed.  Constitutional:      General: He is not in acute distress.    Appearance: He is well-developed. He is not ill-appearing or toxic-appearing.  HENT:     Head: Normocephalic and atraumatic.     Right Ear: Ear canal and external ear normal. A middle ear effusion is present. Tympanic membrane is not erythematous, retracted or bulging.     Left Ear: Ear canal and external ear normal. A middle ear effusion is present. Tympanic membrane is not erythematous, retracted  or bulging.     Nose: Nose normal.     Right Sinus: No maxillary sinus tenderness or frontal sinus tenderness.     Left Sinus: No maxillary sinus tenderness or frontal sinus tenderness.     Mouth/Throat:     Pharynx: Uvula midline. No posterior oropharyngeal erythema.  Eyes:     General: Lids are normal.     Conjunctiva/sclera: Conjunctivae normal.  Neck:     Trachea: Trachea normal.  Cardiovascular:     Rate and Rhythm: Normal rate and regular rhythm.     Heart sounds: Normal heart sounds, S1 normal and S2 normal.  Pulmonary:     Effort: Pulmonary effort is normal.     Breath sounds: Normal breath sounds. No decreased breath sounds, wheezing, rhonchi or rales.  Lymphadenopathy:     Cervical: No cervical  adenopathy.  Skin:    General: Skin is warm and dry.  Neurological:     Mental Status: He is alert.  Psychiatric:        Speech: Speech normal.        Behavior: Behavior normal. Behavior is cooperative.     Assessment and Plan:   Assessment and Plan    Cough Symptoms improved, likely viral etiology. No antibiotics needed unless symptoms worsen or plateau. No red flags on discussion, patient is not in any obvious distress during our visit. Discussed progression of most viral illness, and recommended supportive care at this point in time. I did however provide pocket rx for oral azithromycin  should symptoms not improve as anticipated. Discussed over the counter supportive care options, with recommendations to push fluids and rest. Reviewed return precautions including new/worsening fever, SOB, new/worsening cough or other concerns.  Recommended need to self-quarantine and practice social distancing until symptoms resolve. Discussed current recommendations for COVID testing. I recommend that patient follow-up if symptoms worsen or persist despite treatment x 7-10 days, sooner if needed.     Lucie Buttner, PA-C

## 2024-04-02 ENCOUNTER — Encounter: Payer: Self-pay | Admitting: Physician Assistant

## 2024-05-14 ENCOUNTER — Telehealth: Payer: Self-pay | Admitting: Physician Assistant

## 2024-05-14 DIAGNOSIS — R051 Acute cough: Secondary | ICD-10-CM

## 2024-05-14 DIAGNOSIS — M791 Myalgia, unspecified site: Secondary | ICD-10-CM

## 2024-05-14 DIAGNOSIS — R6889 Other general symptoms and signs: Secondary | ICD-10-CM

## 2024-05-14 MED ORDER — PROMETHAZINE-DM 6.25-15 MG/5ML PO SYRP: 5.0000 mL | ORAL_SOLUTION | Freq: Four times a day (QID) | ORAL | 0 refills | Status: AC | PRN

## 2024-05-14 MED ORDER — OSELTAMIVIR PHOSPHATE 75 MG PO CAPS: 75.0000 mg | ORAL_CAPSULE | Freq: Two times a day (BID) | ORAL | 0 refills | Status: AC

## 2024-05-14 NOTE — Progress Notes (Signed)
 E visit for Flu like symptoms   We are sorry that you are not feeling well.  Here is how we plan to help! Based on what you have shared with me it looks like you may have a respiratory virus that may be influenza.  Influenza or the flu is  an infection caused by a respiratory virus. The flu virus is highly contagious and persons who did not receive their yearly flu vaccination may catch the flu from close contact.  We have anti-viral medications to treat the viruses that cause this infection. They are not a cure and only shorten the course of the infection. These prescriptions are most effective when they are given within the first 2 days of flu symptoms. Antiviral medications are indicated if you have a high risk of complications from the flu. You should  also consider an antiviral medication if you are in close contact with someone who is at risk. These medications can help patients avoid complications from the flu but have side effects that you should know.   Possible side effects from Tamiflu  or oseltamivir  include nausea, vomiting, diarrhea, dizziness, headaches, eye redness, sleep problems or other respiratory symptoms. You should not take Tamiflu  if you have an allergy to oseltamivir  or any to the ingredients in Tamiflu .  Based upon your symptoms and potential risk factors I have prescribed Oseltamivir  (Tamiflu ).  It has been sent to your designated pharmacy.  You will take one 75 mg capsule orally twice a day for the next 5 days.   For nasal congestion, you may use an oral decongestant such as Mucinex D or if you have glaucoma or high blood pressure use plain Mucinex.  Saline nasal spray or nasal drops can help and can safely be used as often as needed for congestion.  If you have a sore or scratchy throat, use a saltwater gargle-  to  teaspoon of salt dissolved in a 4-ounce to 8-ounce glass of warm water.  Gargle the solution for approximately 15-30 seconds and then spit.  It is  important not to swallow the solution.  You can also use throat lozenges/cough drops and Chloraseptic spray to help with throat pain or discomfort.  Warm or cold liquids can also be helpful in relieving throat pain.  For headache, pain or general discomfort, you can use Ibuprofen  or Tylenol  as directed.   Some authorities believe that zinc sprays or the use of Echinacea may shorten the course of your symptoms.  I have prescribed the following medications to help lessen symptoms: I have prescribed Phenergan  DM 6.25 mg/15 mg. You make take one teaspoon / 5 ml every 4-6 hours as needed for cough  You are to isolate at home until you have been fever-free for at least 24 hours without a fever-reducing medication, and symptoms have been steadily improving for 24 hours.  If you must be around other household members who do not have symptoms, you need to make sure that both you and the family members are masking consistently with a high-quality mask.  If you note any worsening of symptoms despite treatment, please seek an in-person evaluation ASAP. If you note any significant shortness of breath or any chest pain, please seek ED evaluation. Please do not delay care!  ANYONE WHO HAS FLU SYMPTOMS SHOULD: Stay home. The flu is highly contagious and going out or to work exposes others! Be sure to drink plenty of fluids. Water is fine as well as fruit juices, sodas and electrolyte beverages. You  may want to stay away from caffeine or alcohol. If you are nauseated, try taking small sips of liquids. How do you know if you are getting enough fluid? Your urine should be a pale yellow or almost colorless. Get rest. Taking a steamy shower or using a humidifier may help nasal congestion and ease sore throat pain. Using a saline nasal spray works much the same way. Cough drops, hard candies and sore throat lozenges may ease your cough. Line up a caregiver. Have someone check on you regularly.  GET HELP RIGHT AWAY  IF: You cannot keep down liquids or your medications. You become short of breath Your fell like you are going to pass out or loose consciousness. Your symptoms persist after you have completed your treatment plan  MAKE SURE YOU  Understand these instructions. Will watch your condition. Will get help right away if you are not doing well or get worse.  Your e-visit answers were reviewed by a board certified advanced clinical practitioner to complete your personal care plan.  Depending on the condition, your plan could have included both over the counter or prescription medications.  If there is a problem please reply  once you have received a response from your provider.  Your safety is important to us .  If you have drug allergies check your prescription carefully.    You can use MyChart to ask questions about todays visit, request a non-urgent call back, or ask for a work or school excuse for 24 hours related to this e-Visit. If it has been greater than 24 hours you will need to follow up with your provider, or enter a new e-Visit to address those concerns.  You will get an e-mail in the next two days asking about your experience.  I hope that your e-visit has been valuable and will speed your recovery. Thank you for using e-visits.   I have spent 5 minutes in review of e-visit questionnaire, review and updating patient chart, medical decision making and response to patient.   Delon CHRISTELLA Dickinson, PA-C

## 2024-05-17 ENCOUNTER — Encounter: Payer: Self-pay | Admitting: Family

## 2024-05-17 ENCOUNTER — Ambulatory Visit: Payer: Self-pay | Admitting: Family

## 2024-05-17 VITALS — BP 122/78 | HR 58 | Temp 97.5°F | Ht 71.0 in | Wt 252.1 lb

## 2024-05-17 DIAGNOSIS — J101 Influenza due to other identified influenza virus with other respiratory manifestations: Secondary | ICD-10-CM

## 2024-05-17 NOTE — Progress Notes (Signed)
 "  Patient ID: Jack Buchanan, male    DOB: 12-21-2000, 24 y.o.   MRN: [id number]  Chief Complaint  Patient presents with   Cough    Pt c/o cough with clear mucus, headache,fever of 102.0 and body aches. Present since 12/31. Positive for flu A on 1/2. Has tried dayquil, which did helps slightly.   Discussed the use of AI scribe software for clinical note transcription with the patient, who gave verbal consent to proceed.  History of Present Illness He is a 24 year old male who presents with loss of voice and cough due to the flu.  He reports testing positive for flu on 1/2 after having sx for 3 days. He did an e-visit and was prescribed Tamiflu  but did not start due to worried about reported side effects.  His voice was severely hoarse on the first day with a cough but both ares slowly improving, but he still has difficulty speaking. His earlier cough has mostly resolved and is not a current concern, and he denies any sinus symptoms.   Assessment & Plan Influenza A (H1N1) Symptoms improving with resolution of cough and return of voice. Declined Tamoxifen due to side effect concerns. Xofluza discussed as an alternative. - Encouraged hydration, zinc, and vitamin C intake with first day of symptoms next viral illness. - Encourage hydration with at least 2.5-3 liters daily of caffeine free liquids. - Work note provided to return to work. - Advised to report if symptoms do not improve.   Subjective:    Outpatient Medications Prior to Visit  Medication Sig Dispense Refill   albuterol  (VENTOLIN  HFA) 108 (90 Base) MCG/ACT inhaler Inhale 2 puffs into the lungs every 6 (six) hours as needed for wheezing or shortness of breath (For Wheezing). 8.5 each 2   oseltamivir  (TAMIFLU ) 75 MG capsule Take 1 capsule (75 mg total) by mouth 2 (two) times daily. 10 capsule 0   promethazine -dextromethorphan (PROMETHAZINE -DM) 6.25-15 MG/5ML syrup Take 5 mLs by mouth 4 (four) times daily as needed. 118 mL 0   No  facility-administered medications prior to visit.   Past Medical History:  Diagnosis Date   ADD (attention deficit disorder)    Past Surgical History:  Procedure Laterality Date   GUM SURGERY     tubes in ears     Allergies[1]    Objective:    Physical Exam Vitals and nursing note reviewed.  Constitutional:      General: He is not in acute distress.    Appearance: Normal appearance. He is ill-appearing.  HENT:     Head: Normocephalic.     Right Ear: Tympanic membrane and ear canal normal.     Left Ear: Tympanic membrane and ear canal normal.     Nose:     Right Sinus: Frontal sinus tenderness present. No maxillary sinus tenderness.     Left Sinus: Frontal sinus tenderness present. No maxillary sinus tenderness.     Mouth/Throat:     Mouth: Mucous membranes are moist.     Pharynx: No pharyngeal swelling, oropharyngeal exudate, posterior oropharyngeal erythema or uvula swelling.     Tonsils: No tonsillar exudate or tonsillar abscesses.  Cardiovascular:     Rate and Rhythm: Normal rate and regular rhythm.  Pulmonary:     Effort: Pulmonary effort is normal.     Breath sounds: Normal breath sounds.  Musculoskeletal:        General: Normal range of motion.     Cervical back: Normal range of motion.  Lymphadenopathy:     Head:     Right side of head: No preauricular or posterior auricular adenopathy.     Left side of head: No preauricular or posterior auricular adenopathy.     Cervical: No cervical adenopathy.  Skin:    General: Skin is warm and dry.  Neurological:     Mental Status: He is alert and oriented to person, place, and time.  Psychiatric:        Mood and Affect: Mood normal.    BP 122/78 (BP Location: Left Arm, Patient Position: Sitting, Cuff Size: Large)   Pulse (!) 58   Temp (!) 97.5 F (36.4 C) (Temporal)   Ht 5' 11 (1.803 m)   Wt 252 lb 2 oz (114.4 kg)   SpO2 98%   BMI 35.16 kg/m  Wt Readings from Last 3 Encounters:  05/17/24 252 lb 2 oz (114.4  kg)  03/31/24 258 lb 6.1 oz (117.2 kg)  03/31/23 266 lb (120.7 kg)       Lucius Krabbe, NP     [1]  Allergies Allergen Reactions   Motrin [Ibuprofen] Anaphylaxis   "

## 2024-06-09 ENCOUNTER — Encounter: Admitting: Physician Assistant
